# Patient Record
Sex: Male | Born: 1985 | Race: White | Hispanic: No | Marital: Single | Smoking: Current some day smoker
Health system: Southern US, Community
[De-identification: ages and names within clinical notes are randomized; demographics above are authoritative.]

## PROBLEM LIST (undated history)

## (undated) DIAGNOSIS — K219 Gastro-esophageal reflux disease without esophagitis: Secondary | ICD-10-CM

## (undated) DIAGNOSIS — I472 Ventricular tachycardia: Secondary | ICD-10-CM

## (undated) DIAGNOSIS — F419 Anxiety disorder, unspecified: Secondary | ICD-10-CM

## (undated) DIAGNOSIS — I10 Essential (primary) hypertension: Secondary | ICD-10-CM

## (undated) DIAGNOSIS — F1910 Other psychoactive substance abuse, uncomplicated: Secondary | ICD-10-CM

## (undated) DIAGNOSIS — I2510 Atherosclerotic heart disease of native coronary artery without angina pectoris: Secondary | ICD-10-CM

## (undated) DIAGNOSIS — I4729 Other ventricular tachycardia: Secondary | ICD-10-CM

## (undated) DIAGNOSIS — I255 Ischemic cardiomyopathy: Secondary | ICD-10-CM

## (undated) HISTORY — PX: OTHER SURGICAL HISTORY: SHX169

---
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
## 2004-10-25 ENCOUNTER — Emergency Department (HOSPITAL_COMMUNITY): Admission: EM | Admit: 2004-10-25 | Discharge: 2004-10-26 | Payer: Self-pay | Admitting: Emergency Medicine

## 2011-06-27 ENCOUNTER — Encounter (HOSPITAL_COMMUNITY): Payer: Self-pay

## 2011-06-27 ENCOUNTER — Emergency Department (HOSPITAL_COMMUNITY): Payer: Medicaid Other

## 2011-06-27 ENCOUNTER — Other Ambulatory Visit: Payer: Self-pay

## 2011-06-27 ENCOUNTER — Emergency Department (HOSPITAL_COMMUNITY)
Admission: EM | Admit: 2011-06-27 | Discharge: 2011-06-27 | Disposition: A | Discharge status: Home / Self Care | Payer: Medicaid Other | Attending: Emergency Medicine | Admitting: Emergency Medicine

## 2011-06-27 DIAGNOSIS — R071 Chest pain on breathing: Secondary | ICD-10-CM | POA: Insufficient documentation

## 2011-06-27 DIAGNOSIS — R109 Unspecified abdominal pain: Secondary | ICD-10-CM | POA: Insufficient documentation

## 2011-06-27 DIAGNOSIS — M549 Dorsalgia, unspecified: Secondary | ICD-10-CM | POA: Insufficient documentation

## 2011-06-27 DIAGNOSIS — R0789 Other chest pain: Secondary | ICD-10-CM

## 2011-06-27 LAB — BASIC METABOLIC PANEL
BUN: 16 mg/dL (ref 6–23)
Chloride: 102 mEq/L (ref 96–112)
GFR calc Af Amer: >90 mL/min (ref >90)
Glucose, Bld: 92 mg/dL (ref 70–99)
Potassium: 4 mEq/L (ref 3.5–5.1)

## 2011-06-27 LAB — URINALYSIS, ROUTINE W REFLEX MICROSCOPIC
Bilirubin Urine: NEGATIVE
Nitrite: NEGATIVE
Specific Gravity, Urine: 1.026 (ref 1.005–1.030)
pH: 6.5 (ref 5.0–8.0)

## 2011-06-27 LAB — DIFFERENTIAL
Lymphs Abs: 3.1 10*3/uL (ref 0.7–4.0)
Monocytes Relative: 8 % (ref 3–12)
Neutro Abs: 6.4 10*3/uL (ref 1.7–7.7)
Neutrophils Relative %: 60 % (ref 43–77)

## 2011-06-27 LAB — CBC
Hemoglobin: 15.4 g/dL (ref 13.0–17.0)
RBC: 5.13 MIL/uL (ref 4.22–5.81)

## 2011-06-27 MED ORDER — HYDROCODONE-ACETAMINOPHEN 5-325 MG PO TABS: 1.0000 | ORAL_TABLET | Freq: Four times a day (QID) | ORAL | Status: AC | PRN

## 2011-06-27 MED ORDER — CYCLOBENZAPRINE HCL 5 MG PO TABS: 5.0000 mg | ORAL_TABLET | Freq: Three times a day (TID) | ORAL | Status: AC | PRN

## 2011-06-27 MED ORDER — MORPHINE SULFATE 4 MG/ML IJ SOLN
4.0000 mg | Freq: Once | INTRAMUSCULAR | Status: AC
  Administered 2011-06-27: 4 mg via INTRAVENOUS
  Filled 2011-06-27: qty 1

## 2011-06-27 MED ORDER — NAPROXEN 500 MG PO TABS: 500.0000 mg | ORAL_TABLET | Freq: Two times a day (BID) | ORAL | Status: AC

## 2011-06-27 NOTE — ED Notes (Signed)
Pt ambulatory to triage.

## 2011-06-27 NOTE — ED Notes (Signed)
Pt. Having productive cough since Saturday and believes he pulled a muscle in the middle of his back which is causing his CP.  Pain increase with lifting an a deep breath

## 2011-06-27 NOTE — ED Provider Notes (Signed)
History     CSN: [id number]  Arrival date & time 06/27/11  1202   First MD Initiated Contact with Patient 06/27/11 1301      Chief Complaint  Patient presents with  . Back Pain    (Consider location/radiation/quality/duration/timing/severity/associated sxs/prior treatment) HPI Patient presents to the emergency room with complaints of back pain since Saturday night. Patient states she's been having a cough and he thinks he may have the muscle. Patient states the pain is very sharp and severe in his left flank between his ribs. He states the pain increases when he takes a deep breath or when he moves. It also hurts to press on that area of his back. He has not had any shortness of breath. Is not having any leg Swelling.  Patient states she's had some discomfort in his abdomen but no vomiting or diarrhea. He has no prior history of blood clots. There is no family history of aortic dissection, pulmonary embolism or heart disease. History reviewed. No pertinent past medical history.  History reviewed. No pertinent past surgical history.  No family history on file.  History  Substance Use Topics  . Smoking status: Current Everyday Smoker  . Smokeless tobacco: Not on file  . Alcohol Use: Yes      Review of Systems  All other systems reviewed and are negative.    Allergies  Review of patient's allergies indicates no known allergies.  Home Medications   Current Outpatient Rx  Name Route Sig Dispense Refill  . ACETAMINOPHEN-CAFFEINE 500-65 MG PO TABS Oral Take 2 capsules by mouth every 6 (six) hours as needed. For headaches    . OVER THE COUNTER MEDICATION Oral Take 2 tablets by mouth 3 (three) times daily before meals. xenadrine for weight loss (vitamin c 7mg , calcium 70mg , plus caffeine and herbal ingredients)    . RANITIDINE HCL 75 MG PO TABS Oral Take 535 mg by mouth 2 (two) times daily. Takes 9 tablets at a time for stomach pain      BP 138/82  Pulse 76  Temp(Src) 97.9  F (36.6 C) (Oral)  Resp 18  Ht 5\' 7"  (1.702 m)  Wt 210 lb (95.255 kg)  BMI 32.89 kg/m2  SpO2 98%  Physical Exam  Nursing note and vitals reviewed. Constitutional: He appears well-developed and well-nourished. He appears distressed.  HENT:  Head: Normocephalic and atraumatic.  Right Ear: External ear normal.  Left Ear: External ear normal.  Eyes: Conjunctivae are normal. Right eye exhibits no discharge. Left eye exhibits no discharge. No scleral icterus.  Neck: Neck supple. No tracheal deviation present.  Cardiovascular: Normal rate, regular rhythm and intact distal pulses.   Pulmonary/Chest: Effort normal and breath sounds normal. No stridor. No respiratory distress. He has no wheezes. He has no rales. He exhibits tenderness.       Tenderness to palpation left posterior thorax in the left flank region, pain increases with movement and palpation  Abdominal: Soft. Bowel sounds are normal. He exhibits no distension and no mass. There is no tenderness. There is no rebound and no guarding.  Musculoskeletal: He exhibits no edema and no tenderness.  Neurological: He is alert. He has normal strength. No sensory deficit. Cranial nerve deficit:  no gross defecits noted. He exhibits normal muscle tone. He displays no seizure activity. Coordination normal.  Skin: Skin is warm and dry. No rash noted.  Psychiatric: He has a normal mood and affect.    ED Course  Procedures (including critical care time)  Date: 06/27/2011  Rate: 71  Rhythm: normal sinus rhythm  QRS Axis: normal  Intervals: normal  ST/T Wave abnormalities: normal  Conduction Disutrbances:none  Narrative Interpretation:   Old EKG Reviewed: none available   Labs Reviewed  CBC - Abnormal; Notable for the following:    WBC 10.8 (*)    All other components within normal limits  BASIC METABOLIC PANEL - Abnormal; Notable for the following:    GFR calc non Af Amer 81 (*)    All other components within normal limits    DIFFERENTIAL  URINALYSIS, ROUTINE W REFLEX MICROSCOPIC  URINALYSIS, ROUTINE W REFLEX MICROSCOPIC   Dg Chest 2 View  06/27/2011  *RADIOLOGY REPORT*  Clinical Data: Chest pain, shortness of breath  CHEST - 2 VIEW  Comparison: None.  Findings: Lungs are clear. No pleural effusion or pneumothorax.  Cardiomediastinal silhouette is within normal limits.  Visualized osseous structures are within normal limits.  IMPRESSION: No evidence of acute cardiopulmonary disease.  Original Report Authenticated By: Charline Bills, M.D.      MDM  I suspect the patient's back pain is related to pleurisy and musculoskeletal chest pain. He has no evidence of pneumonia pneumothorax. The pain is reproducible. Patient has no risk factors to suggest pulmonary embolism and I doubt aortic dissection based on his history presentation.  Patient was discharged with a prescription for pain medications. Encouraged follow up with his primary care Dr. to make sure his symptoms resolve        Celene Kras, MD 06/27/11 [phone]

## 2012-07-21 ENCOUNTER — Encounter (HOSPITAL_COMMUNITY): Payer: Self-pay | Admitting: *Deleted

## 2012-07-21 ENCOUNTER — Emergency Department (HOSPITAL_COMMUNITY): Payer: Medicaid Other

## 2012-07-21 ENCOUNTER — Emergency Department (HOSPITAL_COMMUNITY)
Admission: EM | Admit: 2012-07-21 | Discharge: 2012-07-21 | Disposition: A | Discharge status: Home / Self Care | Payer: Medicaid Other | Attending: Emergency Medicine | Admitting: Emergency Medicine

## 2012-07-21 DIAGNOSIS — R030 Elevated blood-pressure reading, without diagnosis of hypertension: Secondary | ICD-10-CM | POA: Insufficient documentation

## 2012-07-21 DIAGNOSIS — Y9389 Activity, other specified: Secondary | ICD-10-CM | POA: Insufficient documentation

## 2012-07-21 DIAGNOSIS — F17200 Nicotine dependence, unspecified, uncomplicated: Secondary | ICD-10-CM | POA: Insufficient documentation

## 2012-07-21 DIAGNOSIS — Y929 Unspecified place or not applicable: Secondary | ICD-10-CM | POA: Insufficient documentation

## 2012-07-21 DIAGNOSIS — S2232XA Fracture of one rib, left side, initial encounter for closed fracture: Secondary | ICD-10-CM

## 2012-07-21 DIAGNOSIS — Z79899 Other long term (current) drug therapy: Secondary | ICD-10-CM | POA: Insufficient documentation

## 2012-07-21 DIAGNOSIS — S2239XA Fracture of one rib, unspecified side, initial encounter for closed fracture: Secondary | ICD-10-CM | POA: Insufficient documentation

## 2012-07-21 DIAGNOSIS — W108XXA Fall (on) (from) other stairs and steps, initial encounter: Secondary | ICD-10-CM | POA: Insufficient documentation

## 2012-07-21 MED ORDER — BENZONATATE 100 MG PO CAPS: 100.0000 mg | ORAL_CAPSULE | Freq: Three times a day (TID) | ORAL | Status: DC

## 2012-07-21 MED ORDER — OXYCODONE-ACETAMINOPHEN 5-325 MG PO TABS
2.0000 | ORAL_TABLET | Freq: Once | ORAL | Status: AC
  Administered 2012-07-21: 2 via ORAL
  Filled 2012-07-21: qty 2

## 2012-07-21 MED ORDER — OXYCODONE-ACETAMINOPHEN 5-325 MG PO TABS: ORAL_TABLET | ORAL | Status: DC

## 2012-07-21 NOTE — ED Notes (Signed)
Pt states fell down 6 stairs this morning around 0800, landed on L side, complaining of L rib pain, states 10/10 when coughing/laughing or moving, no pain when sitting still. Pt denies shortness of breath or LOC.

## 2012-07-21 NOTE — ED Provider Notes (Signed)
History     CSN: [id number]  Arrival date & time 07/21/12  1226   First MD Initiated Contact with Patient 07/21/12 1240      Chief Complaint  Patient presents with  . Fall  . Pain    (Consider location/radiation/quality/duration/timing/severity/associated sxs/prior treatment) HPI  Mitchell Stokes is a 27 y.o. male complaining of left posterior rib pain status post trip and fall this morning down 6 step. Patient denies head trauma, LOC, nausea vomiting, headache, shortness of breath, abdominal pain, change in bowel or bladder habits.  History reviewed. No pertinent past medical history.  History reviewed. No pertinent past surgical history.  No family history on file.  History  Substance Use Topics  . Smoking status: Current Every Day Smoker  . Smokeless tobacco: Not on file  . Alcohol Use: Yes      Review of Systems  Constitutional: Negative for fever.  Respiratory: Negative for shortness of breath.   Cardiovascular: Negative for chest pain.  Gastrointestinal: Negative for nausea, vomiting, abdominal pain and diarrhea.  All other systems reviewed and are negative.    Allergies  Review of patient's allergies indicates no known allergies.  Home Medications   Current Outpatient Rx  Name  Route  Sig  Dispense  Refill  . Acetaminophen-Caffeine (TENSION HEADACHE) 500-65 MG TABS   Oral   Take 2 capsules by mouth every 6 (six) hours as needed. For headaches         . OVER THE COUNTER MEDICATION   Oral   Take 2 tablets by mouth 3 (three) times daily before meals. xenadrine for weight loss (vitamin c 7mg , calcium 70mg , plus caffeine and herbal ingredients)         . ranitidine (ZANTAC) 75 MG tablet   Oral   Take 535 mg by mouth 2 (two) times daily. Takes 9 tablets at a time for stomach pain           BP 144/107  Pulse 76  Temp(Src) 98 F (36.7 C) (Oral)  Resp 18  SpO2 97%  Physical Exam  Nursing note and vitals reviewed. Constitutional: He is  oriented to person, place, and time. He appears well-developed and well-nourished. No distress.  HENT:  Head: Normocephalic.  Eyes: Conjunctivae and EOM are normal.  Cardiovascular: Normal rate.   Pulmonary/Chest: Effort normal and breath sounds normal. No stridor. No respiratory distress. He has no wheezes. He has no rales.   He exhibits tenderness.  Abdominal: Soft.  Musculoskeletal: Normal range of motion.  Neurological: He is alert and oriented to person, place, and time.  Psychiatric: He has a normal mood and affect.    ED Course  Procedures (including critical care time)  Labs Reviewed - No data to display Dg Ribs Unilateral W/chest Left  07/21/2012  *RADIOLOGY REPORT*  Clinical Data: Pain post trauma  LEFT RIBS AND CHEST - 3+ VIEW  Frontal chest; oblique, coned-down lower left ribs  Comparison:  Chest radiograph June 27, 2011  Findings:  There is a fracture of the posterior left ninth rib, minimally displaced.  There is no other acute fracture.  No pneumothorax or effusion.  Lungs clear.  Heart size and pulmonary vascularity are normal.  IMPRESSION: Fracture posterior left ninth rib.  No pneumothorax.  Lungs clear.   Original Report Authenticated By: Bretta Bang, M.D.      1. Rib fracture, left, closed, initial encounter   2. Elevated BP       MDM  Isolated rib fracture with no  pneumothorax. We'll be aggressive in my pain control I have advised him to take deep breaths every hour to prevent atelectasis and pneumonia. Patient verbalizes understanding. Return precautions given.  Discharge Medication List as of 07/21/2012  1:35 PM    START taking these medications   Details  benzonatate (TESSALON) 100 MG capsule Take 1 capsule (100 mg total) by mouth every 8 (eight) hours., Starting 07/21/2012, Until Discontinued, Print    oxyCODONE-acetaminophen (PERCOCET/ROXICET) 5-325 MG per tablet 1 to 2 tabs PO q6hrs  PRN for pain, Print              Wynetta Emery,  PA-C 07/21/12 1358

## 2012-07-23 NOTE — ED Provider Notes (Signed)
Medical screening examination/treatment/procedure(s) were performed by non-physician practitioner and as supervising physician I was immediately available for consultation/collaboration.   Suzi Roots, MD 07/23/12 [phone]

## 2012-11-09 ENCOUNTER — Encounter (HOSPITAL_COMMUNITY): Payer: Self-pay | Admitting: Cardiology

## 2012-11-09 ENCOUNTER — Inpatient Hospital Stay (HOSPITAL_COMMUNITY)
Admission: EM | Admit: 2012-11-09 | Discharge: 2012-11-16 | DRG: 247 | Disposition: A | Discharge status: Home / Self Care | Payer: Medicaid Other | Attending: Cardiology | Admitting: Cardiology

## 2012-11-09 ENCOUNTER — Observation Stay (HOSPITAL_COMMUNITY): Payer: Medicaid Other

## 2012-11-09 DIAGNOSIS — E785 Hyperlipidemia, unspecified: Secondary | ICD-10-CM | POA: Diagnosis present

## 2012-11-09 DIAGNOSIS — F1910 Other psychoactive substance abuse, uncomplicated: Secondary | ICD-10-CM | POA: Diagnosis present

## 2012-11-09 DIAGNOSIS — Y840 Cardiac catheterization as the cause of abnormal reaction of the patient, or of later complication, without mention of misadventure at the time of the procedure: Secondary | ICD-10-CM | POA: Diagnosis not present

## 2012-11-09 DIAGNOSIS — E782 Mixed hyperlipidemia: Secondary | ICD-10-CM

## 2012-11-09 DIAGNOSIS — F1210 Cannabis abuse, uncomplicated: Secondary | ICD-10-CM | POA: Diagnosis present

## 2012-11-09 DIAGNOSIS — Z955 Presence of coronary angioplasty implant and graft: Secondary | ICD-10-CM

## 2012-11-09 DIAGNOSIS — I4729 Other ventricular tachycardia: Secondary | ICD-10-CM | POA: Diagnosis present

## 2012-11-09 DIAGNOSIS — I748 Embolism and thrombosis of other arteries: Secondary | ICD-10-CM | POA: Diagnosis not present

## 2012-11-09 DIAGNOSIS — I2510 Atherosclerotic heart disease of native coronary artery without angina pectoris: Secondary | ICD-10-CM | POA: Diagnosis present

## 2012-11-09 DIAGNOSIS — K219 Gastro-esophageal reflux disease without esophagitis: Secondary | ICD-10-CM | POA: Diagnosis present

## 2012-11-09 DIAGNOSIS — I214 Non-ST elevation (NSTEMI) myocardial infarction: Secondary | ICD-10-CM

## 2012-11-09 DIAGNOSIS — I2589 Other forms of chronic ischemic heart disease: Secondary | ICD-10-CM | POA: Diagnosis present

## 2012-11-09 DIAGNOSIS — I998 Other disorder of circulatory system: Secondary | ICD-10-CM | POA: Diagnosis not present

## 2012-11-09 DIAGNOSIS — F17200 Nicotine dependence, unspecified, uncomplicated: Secondary | ICD-10-CM | POA: Diagnosis present

## 2012-11-09 DIAGNOSIS — R079 Chest pain, unspecified: Secondary | ICD-10-CM

## 2012-11-09 DIAGNOSIS — F419 Anxiety disorder, unspecified: Secondary | ICD-10-CM | POA: Diagnosis present

## 2012-11-09 DIAGNOSIS — R9431 Abnormal electrocardiogram [ECG] [EKG]: Secondary | ICD-10-CM

## 2012-11-09 DIAGNOSIS — I10 Essential (primary) hypertension: Secondary | ICD-10-CM

## 2012-11-09 DIAGNOSIS — I472 Ventricular tachycardia, unspecified: Secondary | ICD-10-CM | POA: Diagnosis not present

## 2012-11-09 DIAGNOSIS — F1110 Opioid abuse, uncomplicated: Secondary | ICD-10-CM | POA: Diagnosis present

## 2012-11-09 DIAGNOSIS — I255 Ischemic cardiomyopathy: Secondary | ICD-10-CM | POA: Diagnosis present

## 2012-11-09 DIAGNOSIS — I2109 ST elevation (STEMI) myocardial infarction involving other coronary artery of anterior wall: Principal | ICD-10-CM

## 2012-11-09 DIAGNOSIS — I519 Heart disease, unspecified: Secondary | ICD-10-CM

## 2012-11-09 DIAGNOSIS — F1010 Alcohol abuse, uncomplicated: Secondary | ICD-10-CM | POA: Diagnosis present

## 2012-11-09 DIAGNOSIS — F411 Generalized anxiety disorder: Secondary | ICD-10-CM | POA: Diagnosis present

## 2012-11-09 HISTORY — DX: Gastro-esophageal reflux disease without esophagitis: K21.9

## 2012-11-09 HISTORY — DX: Essential (primary) hypertension: I10

## 2012-11-09 HISTORY — DX: Anxiety disorder, unspecified: F41.9

## 2012-11-09 HISTORY — DX: Other psychoactive substance abuse, uncomplicated: F19.10

## 2012-11-09 HISTORY — DX: Atherosclerotic heart disease of native coronary artery without angina pectoris: I25.10

## 2012-11-09 HISTORY — DX: Other ventricular tachycardia: I47.29

## 2012-11-09 HISTORY — DX: Ischemic cardiomyopathy: I25.5

## 2012-11-09 HISTORY — DX: Ventricular tachycardia: I47.2

## 2012-11-09 LAB — TROPONIN I
Troponin I: <0.3 ng/mL (ref <0.30)
Troponin I: 0.8 ng/mL (ref <0.30)

## 2012-11-09 LAB — BASIC METABOLIC PANEL
BUN: 12 mg/dL (ref 6–23)
CO2: 23 mEq/L (ref 19–32)
Calcium: 9.2 mg/dL (ref 8.4–10.5)
Chloride: 102 mEq/L (ref 96–112)
Creatinine, Ser: 1.1 mg/dL (ref 0.50–1.35)
GFR calc Af Amer: >90 mL/min (ref >90)
GFR calc non Af Amer: >90 mL/min (ref >90)
Glucose, Bld: 103 mg/dL — ABNORMAL HIGH (ref 70–99)
Potassium: 3.3 mEq/L — ABNORMAL LOW (ref 3.5–5.1)
Sodium: 140 mEq/L (ref 135–145)

## 2012-11-09 LAB — CBC
HCT: 44.4 % (ref 39.0–52.0)
Hemoglobin: 16 g/dL (ref 13.0–17.0)
MCH: 30.6 pg (ref 26.0–34.0)
MCHC: 36 g/dL (ref 30.0–36.0)
MCV: 84.9 fL (ref 78.0–100.0)
Platelets: 222 10*3/uL (ref 150–400)
RBC: 5.23 MIL/uL (ref 4.22–5.81)
RDW: 12.5 % (ref 11.5–15.5)
WBC: 10.7 10*3/uL — ABNORMAL HIGH (ref 4.0–10.5)

## 2012-11-09 LAB — RAPID URINE DRUG SCREEN, HOSP PERFORMED
Amphetamines: NOT DETECTED
Barbiturates: NOT DETECTED
Benzodiazepines: NOT DETECTED
Cocaine: NOT DETECTED
Opiates: POSITIVE — AB
Tetrahydrocannabinol: POSITIVE — AB

## 2012-11-09 LAB — PROTIME-INR
INR: 0.9 (ref 0.00–1.49)
Prothrombin Time: 12.1 seconds (ref 11.6–15.2)

## 2012-11-09 LAB — AMYLASE: Amylase: 46 U/L (ref 0–105)

## 2012-11-09 LAB — COMPREHENSIVE METABOLIC PANEL
BUN: 12 mg/dL (ref 6–23)
CO2: 23 mEq/L (ref 19–32)
Calcium: 8.7 mg/dL (ref 8.4–10.5)
Chloride: 101 mEq/L (ref 96–112)
Creatinine, Ser: 1.01 mg/dL (ref 0.50–1.35)
GFR calc Af Amer: >90 mL/min (ref >90)
GFR calc non Af Amer: >90 mL/min (ref >90)
Glucose, Bld: 102 mg/dL — ABNORMAL HIGH (ref 70–99)
Total Bilirubin: 0.5 mg/dL (ref 0.3–1.2)

## 2012-11-09 LAB — LIPASE, BLOOD: Lipase: 17 U/L (ref 11–59)

## 2012-11-09 LAB — MRSA PCR SCREENING: MRSA by PCR: NEGATIVE

## 2012-11-09 LAB — APTT: aPTT: 26 seconds (ref 24–37)

## 2012-11-09 LAB — PRO B NATRIURETIC PEPTIDE: Pro B Natriuretic peptide (BNP): 437.4 pg/mL — ABNORMAL HIGH (ref 0–125)

## 2012-11-09 MED ORDER — LORAZEPAM 2 MG/ML IJ SOLN
0.5000 mg | Freq: Once | INTRAMUSCULAR | Status: AC
  Administered 2012-11-09: 0.5 mg via INTRAVENOUS
  Filled 2012-11-09: qty 1

## 2012-11-09 MED ORDER — ACETAMINOPHEN 325 MG PO TABS
650.0000 mg | ORAL_TABLET | ORAL | Status: DC | PRN
  Administered 2012-11-10: 650 mg via ORAL
  Filled 2012-11-09 (×2): qty 2

## 2012-11-09 MED ORDER — ONDANSETRON HCL 4 MG/2ML IJ SOLN
4.0000 mg | Freq: Four times a day (QID) | INTRAMUSCULAR | Status: DC | PRN
  Administered 2012-11-09 – 2012-11-12 (×3): 4 mg via INTRAVENOUS
  Filled 2012-11-09 (×3): qty 2

## 2012-11-09 MED ORDER — ASPIRIN EC 81 MG PO TBEC
81.0000 mg | DELAYED_RELEASE_TABLET | Freq: Every day | ORAL | Status: DC
  Administered 2012-11-11 – 2012-11-16 (×5): 81 mg via ORAL
  Filled 2012-11-09 (×7): qty 1

## 2012-11-09 MED ORDER — NITROGLYCERIN IN D5W 200-5 MCG/ML-% IV SOLN
2.0000 ug/min | Freq: Once | INTRAVENOUS | Status: AC
  Administered 2012-11-09: 10 ug/min via INTRAVENOUS

## 2012-11-09 MED ORDER — HEPARIN BOLUS VIA INFUSION
4000.0000 [IU] | Freq: Once | INTRAVENOUS | Status: AC
  Administered 2012-11-09: 4000 [IU] via INTRAVENOUS

## 2012-11-09 MED ORDER — HEPARIN (PORCINE) IN NACL 100-0.45 UNIT/ML-% IJ SOLN
2000.0000 [IU]/h | INTRAMUSCULAR | Status: DC
  Administered 2012-11-09: 1250 [IU]/h via INTRAVENOUS
  Filled 2012-11-09 (×4): qty 250

## 2012-11-09 MED ORDER — ASPIRIN 81 MG PO CHEW: 324.0000 mg | CHEWABLE_TABLET | ORAL | Status: AC

## 2012-11-09 MED ORDER — FOLIC ACID 1 MG PO TABS
1.0000 mg | ORAL_TABLET | Freq: Every day | ORAL | Status: DC
  Administered 2012-11-09 – 2012-11-16 (×7): 1 mg via ORAL
  Filled 2012-11-09 (×8): qty 1

## 2012-11-09 MED ORDER — MORPHINE SULFATE 2 MG/ML IJ SOLN
2.0000 mg | INTRAMUSCULAR | Status: DC | PRN
  Administered 2012-11-10 – 2012-11-13 (×10): 2 mg via INTRAVENOUS
  Filled 2012-11-09 (×12): qty 1

## 2012-11-09 MED ORDER — LORAZEPAM 2 MG/ML IJ SOLN: 1.0000 mg | Freq: Four times a day (QID) | INTRAMUSCULAR | Status: AC | PRN

## 2012-11-09 MED ORDER — AMLODIPINE BESYLATE 2.5 MG PO TABS
2.5000 mg | ORAL_TABLET | Freq: Every day | ORAL | Status: DC
  Administered 2012-11-09 – 2012-11-10 (×2): 2.5 mg via ORAL
  Filled 2012-11-09 (×3): qty 1

## 2012-11-09 MED ORDER — NITROGLYCERIN 0.4 MG SL SUBL
0.4000 mg | SUBLINGUAL_TABLET | SUBLINGUAL | Status: DC | PRN
  Administered 2012-11-09 – 2012-11-12 (×5): 0.4 mg via SUBLINGUAL
  Filled 2012-11-09 (×3): qty 25

## 2012-11-09 MED ORDER — VITAMIN B-1 100 MG PO TABS
100.0000 mg | ORAL_TABLET | Freq: Every day | ORAL | Status: DC
  Administered 2012-11-10 – 2012-11-16 (×6): 100 mg via ORAL
  Filled 2012-11-09 (×8): qty 1

## 2012-11-09 MED ORDER — PANTOPRAZOLE SODIUM 40 MG PO TBEC
40.0000 mg | DELAYED_RELEASE_TABLET | Freq: Every day | ORAL | Status: DC
  Administered 2012-11-09 – 2012-11-16 (×8): 40 mg via ORAL
  Filled 2012-11-09 (×8): qty 1

## 2012-11-09 MED ORDER — ASPIRIN 300 MG RE SUPP
300.0000 mg | RECTAL | Status: AC
  Filled 2012-11-09: qty 1

## 2012-11-09 MED ORDER — NITROGLYCERIN IN D5W 200-5 MCG/ML-% IV SOLN: 3.0000 ug/min | INTRAVENOUS | Status: DC

## 2012-11-09 MED ORDER — SODIUM CHLORIDE 0.9 % IV BOLUS (SEPSIS)
1000.0000 mL | Freq: Once | INTRAVENOUS | Status: AC
  Administered 2012-11-09: 1000 mL via INTRAVENOUS

## 2012-11-09 MED ORDER — THIAMINE HCL 100 MG/ML IJ SOLN
100.0000 mg | Freq: Every day | INTRAMUSCULAR | Status: DC
  Administered 2012-11-09: 100 mg via INTRAVENOUS
  Filled 2012-11-09 (×7): qty 1

## 2012-11-09 MED ORDER — MORPHINE SULFATE 4 MG/ML IJ SOLN
4.0000 mg | Freq: Once | INTRAMUSCULAR | Status: AC
  Administered 2012-11-09: 4 mg via INTRAVENOUS
  Filled 2012-11-09: qty 1

## 2012-11-09 MED ORDER — NITROGLYCERIN IN D5W 200-5 MCG/ML-% IV SOLN: 2.0000 ug/min | INTRAVENOUS | Status: DC

## 2012-11-09 MED ORDER — NITROGLYCERIN IN D5W 200-5 MCG/ML-% IV SOLN
INTRAVENOUS | Status: AC
  Filled 2012-11-09: qty 250

## 2012-11-09 MED ORDER — SODIUM CHLORIDE 0.9 % IV SOLN
Freq: Once | INTRAVENOUS | Status: AC
  Administered 2012-11-09: 14:00:00 via INTRAVENOUS

## 2012-11-09 MED ORDER — MORPHINE SULFATE 4 MG/ML IJ SOLN
INTRAMUSCULAR | Status: AC
  Administered 2012-11-09: 4 mg
  Filled 2012-11-09: qty 1

## 2012-11-09 MED ORDER — SODIUM CHLORIDE 0.9 % IV SOLN
INTRAVENOUS | Status: DC
  Administered 2012-11-09: 20 mL/h via INTRAVENOUS

## 2012-11-09 MED ORDER — LORAZEPAM 2 MG/ML IJ SOLN
2.0000 mg | Freq: Once | INTRAMUSCULAR | Status: AC
  Administered 2012-11-09: 2 mg via INTRAVENOUS
  Filled 2012-11-09: qty 1

## 2012-11-09 MED ORDER — LORAZEPAM 1 MG PO TABS
1.0000 mg | ORAL_TABLET | Freq: Four times a day (QID) | ORAL | Status: AC | PRN
  Administered 2012-11-10 – 2012-11-11 (×3): 1 mg via ORAL
  Filled 2012-11-09 (×3): qty 1

## 2012-11-09 MED ORDER — ADULT MULTIVITAMIN W/MINERALS CH
1.0000 | ORAL_TABLET | Freq: Every day | ORAL | Status: DC
  Administered 2012-11-09 – 2012-11-16 (×7): 1 via ORAL
  Filled 2012-11-09 (×8): qty 1

## 2012-11-09 NOTE — ED Notes (Signed)
Pt from home co cp. Initial 9/10 central chest and arm tingling. Per EMS, pt was diaphortic and pale on scene. Received 324 asa and 1 nitro w/relief pta. Pain 3/10 after nitro.

## 2012-11-09 NOTE — Progress Notes (Signed)
ANTICOAGULATION CONSULT NOTE - Initial Consult  Pharmacy Consult for Heparin Indication: chest pain/ACS  No Known Allergies  Patient Measurements: Height: 5\' 9"  (175.3 cm) Weight: 240 lb (108.863 kg) IBW/kg (Calculated) : 70.7 Heparin Dosing Weight: 94.5kg  Vital Signs: Temp: 98.3 F (36.8 C) (06/08 1411) Temp src: Oral (06/08 1411) BP: 150/87 mmHg (06/08 1515) Pulse Rate: 89 (06/08 1515)  Labs:  Recent Labs  11/09/12 1307 11/09/12 1310  HGB  --  16.0  HCT  --  44.4  PLT  --  222  CREATININE  --  1.10  TROPONINI <0.30  --     Estimated Creatinine Clearance: 121.6 ml/min (by C-G formula based on Cr of 1.1).   Medical History: Past Medical History  Diagnosis Date  . HTN (hypertension)     Medications:  See electronic med rec  Assessment: 27yom to start heparin for CP/ACS and EKG abnormalities. No bleeding is reported and patient not on anticoagulants. - Baseline labs pending - Heparin weight: 94.5  Goal of Therapy:  Heparin level 0.3-0.7 units/ml Monitor platelets by anticoagulation protocol: Yes   Plan:  1. Heparin IV bolus 4000 units x 1 2. Heparin drip 1250 units/hr (12.5 ml/hr) 3. Check heparin level 6 hours after initiation 4. Daily heparin level and CBC  Cleon Dew [phone] 11/09/2012,3:31 PM

## 2012-11-09 NOTE — ED Provider Notes (Signed)
History    27yM with CP. Onset less than an hour prior to arrival. Center of his chest. Feels achy. Onset while at rest. Some tingling in both elbows. Pain has been constant since onset, but improved when given nitro prehospital. Also given ASA. Never had pain like this before. Diaphoretic. No cough, fever or chills. No unusual leg pain or swelling. No back pain. Has used cocaine previously, but not in several years. Adamantly denying use recently but was drinking heavily last night. No significant past medical history, but has noted consistently high blood pressures previously when taking his BP at the drug store. Not on meds. Is a smoker. Recently started taking "Fastin," a weight loss supplement.   CSN: [id number]  Arrival date & time 11/09/12  1227   First MD Initiated Contact with Patient 11/09/12 1229      Chief Complaint  Patient presents with  . Chest Pain    (Consider location/radiation/quality/duration/timing/severity/associated sxs/prior treatment) HPI  No past medical history on file.  No past surgical history on file.  No family history on file.  History  Substance Use Topics  . Smoking status: Current Every Day Smoker  . Smokeless tobacco: Not on file  . Alcohol Use: Yes      Review of Systems  All systems reviewed and negative, other than as noted in HPI.   Allergies  Review of patient's allergies indicates no known allergies.  Home Medications  No current outpatient prescriptions on file.  BP 152/96  Pulse 110  Temp(Src) 98.2 F (36.8 C) (Oral)  Resp 12  SpO2 98%  Physical Exam  Nursing note and vitals reviewed. Constitutional: He appears distressed.  Pt appears uncomfortable. Diaphoretic.   HENT:  Head: Normocephalic and atraumatic.  Eyes: Conjunctivae are normal. Right eye exhibits no discharge. Left eye exhibits no discharge.  Neck: Neck supple.  Cardiovascular: Regular rhythm and normal heart sounds.  Exam reveals no gallop and no friction  rub.   No murmur heard. tachycardic  Pulmonary/Chest: Effort normal and breath sounds normal. No respiratory distress.  Abdominal: Soft. He exhibits no distension. There is no tenderness.  Musculoskeletal: He exhibits no edema and no tenderness.  Lower extremities symmetric as compared to each other. No calf tenderness. Negative Homan's. No palpable cords.   Neurological: He is alert.  Skin: Skin is warm. He is diaphoretic.  Psychiatric: He has a normal mood and affect. His behavior is normal. Thought content normal.    ED Course  Procedures (including critical care time)  CRITICAL CARE Performed by: Raeford Razor  Total critical care time: 35 minutes  Critical care time was exclusive of separately billable procedures and treating other patients. Critical care was necessary to treat or prevent imminent or life-threatening deterioration. Critical care was time spent personally by me on the following activities: development of treatment plan with patient and/or surrogate as well as nursing, discussions with consultants, evaluation of patient's response to treatment, examination of patient, obtaining history from patient or surrogate, ordering and performing treatments and interventions, ordering and review of laboratory studies, ordering and review of radiographic studies, pulse oximetry and re-evaluation of patient's condition.   Labs Reviewed  CBC - Abnormal; Notable for the following:    WBC 10.7 (*)    All other components within normal limits  TROPONIN I  BASIC METABOLIC PANEL  URINE RAPID DRUG SCREEN (HOSP PERFORMED)  PROTIME-INR  APTT   No results found.  EKG:  Rhythm: normal sinus Vent. rate 73 BPM PR interval 136  ms QRS duration 92 ms QT/QTc 412/454 ms ST segments:ST depression II,III, aVF Comparison: ST depression new as compared 06/2011    1. Chest pain   2. Abnormal EKG   3. Hypertension       MDM  1:05 PM 27yM with CP. Abnormal EKG and changed from  prior. Pt appears very uncomfortable and diaphoretic. Pt denies drug use but recently started taking "Fastin" weight loss supplement. In reviewing ingredient list, I suspect this is the most likely culprit. ASA given. Morphine and benzos. Nitro if remains hypertensive or symptomatic. No significant past medical history. Heparin ordered for ACS although symptoms pretty atypical. If actually cardiac,  suspect more likely hypertensive crisis or vasospasm versus structural heart disease though. Little in terms of PMHx. Is a smoker and I suspect may have hypertension although no formal diagnosis. Consider other potential etiology such as PE, dissection, pneumothorax, infectious, but my suspicion remains low at this time.   1:12 PM Discussed with Dr Antoine Poche, cardiology. Was at bedside in less than a minute.         Raeford Razor, MD 11/09/12 1357

## 2012-11-09 NOTE — ED Notes (Signed)
Patient transported to X-ray 

## 2012-11-09 NOTE — Progress Notes (Addendum)
Brief X-Cover Note  MD Tresa Endo earlier for patient Mitchell Stokes admitted with chest pain who had initial negative cardiac markers that have resulted positive and he has had some intermittent recurrent CP. His NTG gtt is currently at 20 mcg with improved pain. On my examination he was more comfortable per primary RN but on my evaluation CP/pressure recurrent but different character (less pressure, no burning). He tells me he drinks heavily once a weekly - splitting a galloon or more of tequila. Otherwise, he uses marijuana and no other drugs. He works intermittently doing various jobs but has not worked much recently and took his mother to the [employer] today without issues.   CP improving. Vitals reviewed; BP 137/93  Pulse 88  Temp(Src) 97.9 F (36.6 C) (Oral)  Resp 18  Ht 5\' 8"  (1.727 m)  Wt 107.23 kg (236 lb 6.4 oz)  BMI 35.95 kg/m2  SpO2 100%.fully conversant, mildly uncomfortable; heart RRR, lungs CTAB, extremities warm. Abd soft but mildly tender in sub-xiphoid area. Troponin 0.8. Repeat ECG compared with previous and less ST depression inferiorly. No overt ST elevation. Differential remains unstable angina/ACS given positive troponin. However, he also has some atypical pain along with typical pain (responsive to NTG, pressure-like character). Pancreatitis in addition to or as stressor also possible. Will add on amylase/lipase, transfer Mitchell Stokes to Step-down and consider more urgent LHC should pain not stabilize/resolve.   Leeann Must, MD    Check on Patient again at 19:45. He's CP has resolved but he has SOB and some abdominal discomfort. His family arrived and Mitchell Stokes gave me permission to speak with them. He let me know that he smokes 2-3 bowls of marijuana daily. His family corroborated that he drinks etoh frequently and heavily and he takes any pills he can get his hands on - particularly hydrocodone/oxycodone/xanax. On exam, vitals stable. BP 125/83  Pulse 92  Temp(Src) 97.9 F (36.6  C) (Oral)  Resp 23  Ht 5\' 8"  (1.727 m)  Wt 107.23 kg (236 lb 6.4 oz)  BMI 35.95 kg/m2  SpO2 99%. No CP but some abdominal discomfort. Nausea/vomiting+. Labs about to be drawn evaluating pancreatitis. PRN IV morphone. Anxiolytics. Trending troponins. On heparin.   Leeann Must, MD  Some nausea early this morning and a run of VT ~ 20-25 beats. Potassium 20 meq ordered, phenergan for nausea. He felt improved and CP stable. Troponin going up but hemodynamically stable. Vitals reviewed; BP 121/71  Pulse 78  Temp(Src) 97.9 F (36.6 C) (Oral)  Resp 27  Ht 5\' 8"  (1.727 m)  Wt 107.23 kg (236 lb 6.4 oz)  BMI 35.95 kg/m2  SpO2 94% Will address LHC first thing in AM.   Leeann Must, MD

## 2012-11-09 NOTE — H&P (Signed)
CARDIOLOGY ADMISSION NOTE  Patient ID: Mitchell Stokes MRN: [id number] DOB/AGE: Nov 18, 1985 27 y.o.  Admit date: 11/09/2012 Primary Physician   None Primary Cardiologist   None Chief Complaint    Chest pain  HPI:  I was called urgently to the emergency room to see the patient to was having 7/10 chest discomfort. He has no prior cardiac history though he has a history of hypertension. He said it was mid chest. It was 7 out of 10 in intensity and it started about an hour ago.  he did have some bilateral shoulder discomfort. He said it felt worse sitting up. It was worse with inspiration. He did get initially some sublingual nitroglycerin with improvement. However, the pain returned. He had some improvement with morphine. He received another nitroglycerin during my examination and is now comfortable. He said he had associated nausea but was not describing any shortness of breath, PND or orthopnea. He's not had any palpitations, presyncope or syncope. He's never had this discomfort before. This is not like his previous reflux. His initial EKG did demonstrate some inferior T-wave inversion new from previous EKGs. Of note he does use a dietary supplementation that includes synephrine.  He also was hung over this morning as he and 2 friends split a half gallon of tequila last night. He spent the morning throwing up and having diarrhea.  Past Medical History  Diagnosis Date  . HTN (hypertension)     Past Surgical History  Procedure Laterality Date  . None      No Known Allergies No current facility-administered medications on file prior to encounter.   No current outpatient prescriptions on file prior to encounter.   History   Social History  . Marital Status: Single    Spouse Name: N/A    Number of Children: 2  . Years of Education: N/A   Occupational History  . Not working    Social History Main Topics  . Smoking status: Current Every Day Smoker    Types: Cigarettes  . Smokeless  tobacco: Not on file     Comment: One cigarette per day.   . Alcohol Use: Yes  . Drug Use: Yes    Special: Marijuana  . Sexually Active: Yes   Other Topics Concern  . Not on file   Social History Narrative  . No narrative on file    Family History  Problem Relation Age of Onset  . Hypertension Mother   . Stroke Mother     ROS:  As stated in the HPI and negative for all other systems.  Physical Exam: Blood pressure 152/96, pulse 110, temperature 98.2 F (36.8 C), temperature source Oral, resp. rate 12, height 5\' 9"  (1.753 m), weight 240 lb (108.863 kg), SpO2 98.00%.  GENERAL:  Well appearing HEENT:  Pupils equal round and reactive, fundi not visualized, oral mucosa unremarkable NECK:  No jugular venous distention, waveform within normal limits, carotid upstroke brisk and symmetric, no bruits, no thyromegaly LYMPHATICS:  No cervical, inguinal adenopathy LUNGS:  Clear to auscultation bilaterally BACK:  No CVA tenderness CHEST:  Unremarkable HEART:  PMI not displaced or sustained,S1 and S2 within normal limits, no S3, no S4, no clicks, no rubs, no murmurs ABD:  Flat, positive bowel sounds normal in frequency in pitch, no bruits, no rebound, no guarding, no midline pulsatile mass, no hepatomegaly, no splenomegaly EXT:  2 plus pulses throughout, no edema, no cyanosis no clubbing SKIN:  No rashes no nodules NEURO:  Cranial nerves II  through XII grossly intact, motor grossly intact throughout [facility]:  Cognitively intact, oriented to person place and time  Labs:  Pending  Radiology:  Pending  EKG:  Pending  ASSESSMENT AND PLAN:    CHEST PAIN:  This has predominantly atypical features.  EKG changes could represent ischemia but not acute ST segment elevation. At this point I'm going to manage him medically and I will cycle cardiac enzymes, start heparin and IV nitroglycerin. We'll get an echocardiogram. Pending the outcome of this I will consider further evaluation with either  catheterization or stress testing.   HTN:  He has a history of this but doesn't take medications.  He will be started on a low dose of Norvasc to cover this and possible coronary vasospasm.   SignedRollene Rotunda 11/09/2012, 1:37 PM

## 2012-11-09 NOTE — Progress Notes (Signed)
Dr. Tresa Endo in to see pt.   Pt currently pain free. States he is short of breath and nauseated.  Pt vomited large amount of emesis. Lab work ordered and meds ordered.

## 2012-11-09 NOTE — Progress Notes (Signed)
Pt diaphoretic and nauseated.  C/o chest pain  4/10.   Doing ekg.  Called dr. Tresa Endo.

## 2012-11-09 NOTE — Progress Notes (Signed)
Pt c/o 10/10 chest pain and nausea; pt actively vomiting upon arrival to room. Pt given zofran as ordered. 12 lead EKG obtained with no apparent changes. VSS. Spoke with Dr. Tresa Endo (fellow on call). Order to increase nitro to (was currently at prior to arrival to floor). Order carried out. Pt currently with minimal chest pain (2/10) and resting more comfortably. Dr. Tresa Endo to review patient record and make further recommendations. Will continue to monitor closely.

## 2012-11-10 ENCOUNTER — Encounter (HOSPITAL_COMMUNITY)
Admission: EM | Disposition: A | Discharge status: Home / Self Care | Payer: Self-pay | Source: Home / Self Care | Attending: Cardiology

## 2012-11-10 DIAGNOSIS — I219 Acute myocardial infarction, unspecified: Secondary | ICD-10-CM

## 2012-11-10 DIAGNOSIS — I472 Ventricular tachycardia: Secondary | ICD-10-CM

## 2012-11-10 HISTORY — PX: LEFT HEART CATHETERIZATION WITH CORONARY ANGIOGRAM: SHX5451

## 2012-11-10 LAB — CBC
HCT: 42.8 % (ref 39.0–52.0)
MCH: 29.4 pg (ref 26.0–34.0)
MCHC: 34.6 g/dL (ref 30.0–36.0)
MCV: 84.5 fL (ref 78.0–100.0)
MCV: 85.1 fL (ref 78.0–100.0)
Platelets: 235 10*3/uL (ref 150–400)
RBC: 5.22 MIL/uL (ref 4.22–5.81)
RDW: 12.4 % (ref 11.5–15.5)
RDW: 12.7 % (ref 11.5–15.5)
WBC: 13.7 10*3/uL — ABNORMAL HIGH (ref 4.0–10.5)

## 2012-11-10 LAB — BASIC METABOLIC PANEL
BUN: 12 mg/dL (ref 6–23)
GFR calc Af Amer: >90 mL/min (ref >90)
GFR calc non Af Amer: >90 mL/min (ref >90)
Potassium: 4.2 mEq/L (ref 3.5–5.1)
Sodium: 133 mEq/L — ABNORMAL LOW (ref 135–145)

## 2012-11-10 LAB — HEPARIN LEVEL (UNFRACTIONATED)
Heparin Unfractionated: 0.11 IU/mL — ABNORMAL LOW (ref 0.30–0.70)
Heparin Unfractionated: 0.17 IU/mL — ABNORMAL LOW (ref 0.30–0.70)
Heparin Unfractionated: 0.43 IU/mL (ref 0.30–0.70)

## 2012-11-10 LAB — LIPID PANEL
HDL: 50 mg/dL (ref >39)
LDL Cholesterol: 92 mg/dL (ref 0–99)

## 2012-11-10 SURGERY — LEFT HEART CATHETERIZATION WITH CORONARY ANGIOGRAM
Anesthesia: LOCAL

## 2012-11-10 MED ORDER — SODIUM CHLORIDE 0.9 % IJ SOLN: 3.0000 mL | INTRAMUSCULAR | Status: DC | PRN

## 2012-11-10 MED ORDER — FENTANYL CITRATE 0.05 MG/ML IJ SOLN
INTRAMUSCULAR | Status: AC
  Filled 2012-11-10: qty 2

## 2012-11-10 MED ORDER — ASPIRIN 81 MG PO CHEW
324.0000 mg | CHEWABLE_TABLET | ORAL | Status: AC
  Administered 2012-11-10: 324 mg via ORAL
  Filled 2012-11-10: qty 4

## 2012-11-10 MED ORDER — SODIUM CHLORIDE 0.9 % IV SOLN: 250.0000 mL | INTRAVENOUS | Status: DC | PRN

## 2012-11-10 MED ORDER — HEPARIN SODIUM (PORCINE) 5000 UNIT/ML IJ SOLN
5000.0000 [IU] | Freq: Three times a day (TID) | INTRAMUSCULAR | Status: DC
  Administered 2012-11-11 – 2012-11-12 (×4): 5000 [IU] via SUBCUTANEOUS
  Filled 2012-11-10 (×7): qty 1

## 2012-11-10 MED ORDER — VERAPAMIL HCL 2.5 MG/ML IV SOLN
INTRAVENOUS | Status: AC
  Filled 2012-11-10: qty 2

## 2012-11-10 MED ORDER — MIDAZOLAM HCL 2 MG/2ML IJ SOLN
INTRAMUSCULAR | Status: AC
  Filled 2012-11-10: qty 2

## 2012-11-10 MED ORDER — SODIUM CHLORIDE 0.9 % IJ SOLN: 3.0000 mL | Freq: Two times a day (BID) | INTRAMUSCULAR | Status: DC

## 2012-11-10 MED ORDER — HEPARIN BOLUS VIA INFUSION
3000.0000 [IU] | Freq: Once | INTRAVENOUS | Status: AC
  Administered 2012-11-10: 3000 [IU] via INTRAVENOUS
  Filled 2012-11-10: qty 3000

## 2012-11-10 MED ORDER — SODIUM CHLORIDE 0.9 % IV SOLN
1.0000 mL/kg/h | INTRAVENOUS | Status: AC
  Administered 2012-11-10: 1 mL/kg/h via INTRAVENOUS

## 2012-11-10 MED ORDER — CARVEDILOL 3.125 MG PO TABS
3.1250 mg | ORAL_TABLET | Freq: Two times a day (BID) | ORAL | Status: DC
  Administered 2012-11-10 – 2012-11-11 (×2): 3.125 mg via ORAL
  Filled 2012-11-10 (×4): qty 1

## 2012-11-10 MED ORDER — PROMETHAZINE HCL 25 MG/ML IJ SOLN
12.5000 mg | Freq: Four times a day (QID) | INTRAMUSCULAR | Status: DC | PRN
  Administered 2012-11-10: 12.5 mg via INTRAVENOUS
  Filled 2012-11-10 (×2): qty 1

## 2012-11-10 MED ORDER — CLOPIDOGREL BISULFATE 75 MG PO TABS
75.0000 mg | ORAL_TABLET | Freq: Every day | ORAL | Status: DC
  Administered 2012-11-11 – 2012-11-12 (×2): 75 mg via ORAL
  Filled 2012-11-10 (×3): qty 1

## 2012-11-10 MED ORDER — OXYCODONE HCL 5 MG PO TABS
5.0000 mg | ORAL_TABLET | Freq: Four times a day (QID) | ORAL | Status: DC | PRN
  Administered 2012-11-10 – 2012-11-12 (×2): 5 mg via ORAL
  Filled 2012-11-10 (×2): qty 1

## 2012-11-10 MED ORDER — SODIUM CHLORIDE 0.9 % IV SOLN
INTRAVENOUS | Status: DC
  Administered 2012-11-10: 11:00:00 via INTRAVENOUS

## 2012-11-10 MED ORDER — METOPROLOL TARTRATE 12.5 MG HALF TABLET
12.5000 mg | ORAL_TABLET | Freq: Two times a day (BID) | ORAL | Status: DC
  Filled 2012-11-10 (×2): qty 1

## 2012-11-10 MED ORDER — HEPARIN (PORCINE) IN NACL 2-0.9 UNIT/ML-% IJ SOLN
INTRAMUSCULAR | Status: AC
  Filled 2012-11-10: qty 1000

## 2012-11-10 MED ORDER — LIDOCAINE HCL (PF) 1 % IJ SOLN
INTRAMUSCULAR | Status: AC
  Filled 2012-11-10: qty 30

## 2012-11-10 MED ORDER — ASPIRIN 81 MG PO CHEW: 324.0000 mg | CHEWABLE_TABLET | ORAL | Status: DC

## 2012-11-10 MED ORDER — POTASSIUM CHLORIDE 10 MEQ/100ML IV SOLN
10.0000 meq | INTRAVENOUS | Status: AC
  Administered 2012-11-10 (×2): 10 meq via INTRAVENOUS
  Filled 2012-11-10: qty 200

## 2012-11-10 MED ORDER — LISINOPRIL 2.5 MG PO TABS
2.5000 mg | ORAL_TABLET | Freq: Every day | ORAL | Status: DC
  Administered 2012-11-10 – 2012-11-11 (×2): 2.5 mg via ORAL
  Filled 2012-11-10 (×3): qty 1

## 2012-11-10 MED ORDER — ATORVASTATIN CALCIUM 80 MG PO TABS
80.0000 mg | ORAL_TABLET | Freq: Every day | ORAL | Status: DC
  Administered 2012-11-10 – 2012-11-15 (×6): 80 mg via ORAL
  Filled 2012-11-10 (×7): qty 1

## 2012-11-10 MED ORDER — AMLODIPINE BESYLATE 5 MG PO TABS
5.0000 mg | ORAL_TABLET | Freq: Every day | ORAL | Status: DC
  Administered 2012-11-10: 5 mg via ORAL
  Filled 2012-11-10: qty 1

## 2012-11-10 NOTE — Progress Notes (Signed)
Pt with polysubstance abuse with phentermine assoc STEMI with loss of R wave V1-3 and + troponin  Echo pending  Cath pending  ?spasm vs obstructive disease  On CCB and not BB until we get cath results Will need plavix for 30 d at least and if needs stent >>consider BMS for compliance  Spoke about life change, and the need for abstaining from marijuana and other drugs incl cigs taht can be assoc with spasm, particularly if that is found on cath  Needs desires PCP that accepts medicaid Will need help with anxiety disorder

## 2012-11-10 NOTE — Progress Notes (Signed)
Patient: Mitchell Stokes Date of Encounter: 11/10/2012, 8:55 AM Admit date: 11/09/2012     Subjective  Mr. Toso has no complaints currently. Events overnight noted. He denies CP or SOB. He denies palpitations.   Objective  Physical Exam: Vitals: BP 122/82  Pulse 79  Temp(Src) 98.1 F (36.7 C) (Oral)  Resp 12  Ht 5\' 8"  (1.727 m)  Wt 236 lb 6.4 oz (107.23 kg)  BMI 35.95 kg/m2  SpO2 97% General: Well developed, well appearing, sleepy 27 year old male in no acute distress. Neck: Supple. JVD not elevated. Lungs: Clear bilaterally to auscultation without wheezes, rales, or rhonchi. Breathing is unlabored. Heart: RRR S1 S2 without murmurs, rubs, or gallops.  Abdomen: Soft, non-distended. Extremities: No clubbing or cyanosis. No edema.  Distal pedal pulses are 2+ and equal bilaterally. Neuro: Alert and oriented X 3. Moves all extremities spontaneously. No focal deficits.  Intake/Output:  Intake/Output Summary (Last 24 hours) at 11/10/12 0855 Last data filed at 11/10/12 0700  Gross per 24 hour  Intake 625.31 ml  Output    400 ml  Net 225.31 ml    Inpatient Medications:  . amLODipine  2.5 mg Oral Daily  . aspirin EC  81 mg Oral Daily  . folic acid  1 mg Oral Daily  . multivitamin with minerals  1 tablet Oral Daily  . pantoprazole  40 mg Oral Daily  . thiamine  100 mg Oral Daily   Or  . thiamine  100 mg Intravenous Daily   . sodium chloride 20 mL/hr at 11/10/12 0600  . heparin 2,000 Units/hr (11/10/12 1610)  . nitroGLYCERIN 20 mcg/min (11/10/12 0600)    Labs:  Recent Labs  11/09/12 1637 11/10/12 0520  NA 136 133*  K 3.9 4.2  CL 101 100  CO2 23 20  GLUCOSE 102* 133*  BUN 12 12  CREATININE 1.01 0.96  CALCIUM 8.7 9.5  MG  --  1.9    Recent Labs  11/09/12 1637  AST 37  ALT 45  ALKPHOS 97  BILITOT 0.5  PROT 6.9  ALBUMIN 3.9    Recent Labs  11/09/12 2021  LIPASE 17  AMYLASE 46    Recent Labs  11/09/12 1310 11/10/12 0520  WBC 10.7* 13.7*   HGB 16.0 15.5  HCT 44.4 44.1  MCV 84.9 84.5  PLT 222 235    Recent Labs  11/09/12 1307 11/09/12 1637 11/09/12 2213 11/10/12 0352  TROPONINI <0.30 0.80* 4.66* 7.68*    Recent Labs  11/09/12 2021  HGBA1C 5.5    Recent Labs  11/10/12 0520  CHOL 210*  HDL 50  LDLCALC 92  TRIG 338*  CHOLHDL 4.2    Recent Labs  11/09/12 2021  TSH 0.315*    Recent Labs  11/09/12 1456  INR 0.90    Radiology/Studies: Dg Chest 2 View  11/09/2012   *RADIOLOGY REPORT*  Clinical Data: Chest pain and sweating.  CHEST - 2 VIEW  Comparison: Chest x-ray 07/21/2012.  Findings: Lung volumes are low.  There is some linear bibasilar opacities favored to reflect subsegmental atelectasis.  No definite acute consolidative airspace disease.  No pleural effusions.  No evidence of pulmonary edema.  Heart size is within normal limits. Mediastinal contours are unremarkable.  IMPRESSION: 1.  Low lung volumes with probable bibasilar subsegmental atelectasis.   Original Report Authenticated By: Trudie Reed, M.D.    Telemetry: SR with brief NSVT    Assessment and Plan  1. NSTEMI - troponin continues  to rise; continue IV NTG and IV heparin; plan for cardiac cath today; will order echo 2. NSVT - asymptomatic; reports "pounding and racing" recently since starting "diet pills" 3. Polysubstance abuse - marijuana and alcohol 4. HTN 5. Recent "diet pills" - started Fastin (phentermine) 3 days ago  Dr. Graciela Husbands to see Signed, Rick Duff PA-C

## 2012-11-10 NOTE — Interval H&P Note (Signed)
History and Physical Interval Note:  11/10/2012 2:30 PM  Mitchell Stokes  has presented today for surgery, with the diagnosis of cp  The various methods of treatment have been discussed with the patient and family. After consideration of risks, benefits and other options for treatment, the patient has consented to  Procedure(s): LEFT HEART CATHETERIZATION WITH CORONARY ANGIOGRAM (N/A) as a surgical intervention .  The patient's history has been reviewed, patient examined, no change in status, stable for surgery.  I have reviewed the patient's chart and labs.  Questions were answered to the patient's satisfaction.     Theron Arista [facility] 11/10/2012 2:30 PM

## 2012-11-10 NOTE — Progress Notes (Addendum)
Pt vomited approximately 100cc brown emesis. Questionable vomited up po ativan.  C/o chest pain again 4/10.  Called and notified dr. Leeann Must.  Also notified of third troponin being elevated and 25 beat run of v tach with which pt was asymptomatic.

## 2012-11-10 NOTE — Progress Notes (Addendum)
Pt had 25  beat run of wide QRS  tachycardia.  Pt asymptomatic. Continue to monitor.

## 2012-11-10 NOTE — Progress Notes (Signed)
ANTICOAGULATION CONSULT NOTE - Follow-Up Consult  Pharmacy Consult for Heparin Indication: chest pain/ACS  No Known Allergies  Patient Measurements: Height: 5\' 8"  (172.7 cm) Weight: 236 lb 6.4 oz (107.23 kg) IBW/kg (Calculated) : 68.4 Heparin Dosing Weight: 94.5kg  Vital Signs: Temp: 98 F (36.7 C) (06/09 1148) Temp src: Oral (06/09 1148) BP: 120/68 mmHg (06/09 1148) Pulse Rate: 70 (06/09 1148)  Labs:  Recent Labs  11/09/12 1307 11/09/12 1310 11/09/12 1456 11/09/12 1637 11/09/12 2213 11/10/12 0025 11/10/12 0352 11/10/12 0520 11/10/12 1131  HGB  --  16.0  --   --   --   --   --  15.5  --   HCT  --  44.4  --   --   --   --   --  44.1  --   PLT  --  222  --   --   --   --   --  235  --   APTT  --   --  26  --   --   --   --   --   --   LABPROT  --   --  12.1  --   --   --   --   --   --   INR  --   --  0.90  --   --   --   --   --   --   HEPARINUNFRC  --   --   --   --   --  0.11*  --  0.17* 0.43  CREATININE  --  1.10  --  1.01  --   --   --  0.96  --   TROPONINI <0.30  --   --  0.80* 4.66*  --  7.68*  --   --     Estimated Creatinine Clearance: 135.9 ml/min (by C-G formula based on Cr of 0.96).   Medical History: Past Medical History  Diagnosis Date  . HTN (hypertension)   . GERD (gastroesophageal reflux disease)     Medications:  . sodium chloride 20 mL/hr at 11/10/12 0600  . sodium chloride 75 mL/hr at 11/10/12 1036  . heparin 2,000 Units/hr (11/10/12 1610)  . nitroGLYCERIN 20 mcg/min (11/10/12 0600)    Assessment: 27 yom to start heparin for CP/ACS, EKG abnormalities, and positive troponins.  Heparin level is therapeutic on 2000 units/hr.  No bleeding or complications noted.    Goal of Therapy:  Heparin level 0.3-0.7 units/ml Monitor platelets by anticoagulation protocol: Yes   Plan:  1. Continue IV heparin at current rate. 2. Daily heparin level and CBC 3. F/u plans for heparin after cath lab.  Tad Moore, BCPS  Clinical  Pharmacist Pager [phone]  11/10/2012 1:46 PM

## 2012-11-10 NOTE — Progress Notes (Signed)
Notified cardiology on call of troponin 7.68

## 2012-11-10 NOTE — Progress Notes (Signed)
Pt c/o right arm pain from arterial site up to right shoulder and into chest.  Good pulses in  Right hand.  Pos . allens and pos reverse allens with dampened waveform.  Called and notified dr. Adolm Joseph.  Orders received.  Continue to monitor patient.

## 2012-11-10 NOTE — Progress Notes (Signed)
ANTICOAGULATION CONSULT NOTE - Follow Up Consult  Pharmacy Consult for heparin Indication: chest pain/ACS  Labs:  Recent Labs  11/09/12 1307 11/09/12 1310 11/09/12 1456 11/09/12 1637 11/09/12 2213 11/10/12 0025 11/10/12 0352 11/10/12 0520  HGB  --  16.0  --   --   --   --   --  15.5  HCT  --  44.4  --   --   --   --   --  44.1  PLT  --  222  --   --   --   --   --  235  APTT  --   --  26  --   --   --   --   --   LABPROT  --   --  12.1  --   --   --   --   --   INR  --   --  0.90  --   --   --   --   --   HEPARINUNFRC  --   --   --   --   --  0.11*  --  0.17*  CREATININE  --  1.10  --  1.01  --   --   --   --   TROPONINI <0.30  --   --  0.80* 4.66*  --  7.68*  --     Assessment: 27yo male remains subtherapeutic on heparin despite rebolus and rate increase with little rise in level though lab was drawn just 4hr after bolus and rate change; troponin continues to rise.  Goal of Therapy:  Heparin level 0.3-0.7 units/ml   Plan:  Will rebolus with heparin 3000 units x1 and increase gtt by 4 units/kg/hr to 2000 units/hr and check heparin level in 6hr.  Vernard Gambles, PharmD, BCPS  11/10/2012,6:19 AM

## 2012-11-10 NOTE — Progress Notes (Signed)
ANTICOAGULATION CONSULT NOTE - Follow Up Consult  Pharmacy Consult for heparin Indication: chest pain/ACS  Labs:  Recent Labs  11/09/12 1307 11/09/12 1310 11/09/12 1456 11/09/12 1637 11/09/12 2213 11/10/12 0025  HGB  --  16.0  --   --   --   --   HCT  --  44.4  --   --   --   --   PLT  --  222  --   --   --   --   APTT  --   --  26  --   --   --   LABPROT  --   --  12.1  --   --   --   INR  --   --  0.90  --   --   --   HEPARINUNFRC  --   --   --   --   --  0.11*  CREATININE  --  1.10  --  1.01  --   --   TROPONINI <0.30  --   --  0.80* 4.66*  --     Assessment: 27yo male subtherapeutic on heparin with initial dosing for ACS; troponin continues to rise.  Goal of Therapy:  Heparin level 0.3-0.7 units/ml   Plan:  Will rebolus with heparin 3000 units x1 and increase gtt by 4 units/kg/hr to 1600 units/hr and check heparin level in 6hr.  Vernard Gambles, PharmD, BCPS  11/10/2012,12:58 AM

## 2012-11-10 NOTE — CV Procedure (Signed)
   Cardiac Catheterization Procedure Note  Name: Mitchell Stokes MRN: [id number] DOB: 1986/03/07  Procedure: Left Heart Cath, Selective Coronary Angiography, LV angiography  Indication: 27 yo WM with HTN, hyperlipidemia, and polysubstance abuse ( Etoh, cannibis, and opiates) presents with a septal MI by serial Ecgs and troponin to 7.68.   Procedural Details: The right wrist was prepped, draped, and anesthetized with 1% lidocaine. Using the modified Seldinger technique, a 5 French sheath was introduced into the right radial artery. 3 mg of verapamil was administered through the sheath, weight-based unfractionated heparin was administered intravenously.  There was significant radial artery spasm that improved with additional Verapamil IA. Standard Judkins catheters were used for selective coronary angiography and left ventriculography. Catheter exchanges were performed over an exchange length guidewire. There were no immediate procedural complications. A TR band was used for radial hemostasis at the completion of the procedure.  The patient was transferred to the post catheterization recovery area for further monitoring.  Procedural Findings: Hemodynamics: AO 109/87 mean 98 mm Hg LV 116/20 mm Hg  Coronary angiography: Coronary dominance: right  Left mainstem: Normal  Left anterior descending (LAD): The LAD is flush occluded at the ostium. There are right to left collaterals from the RCA via the proximal septal perforators. There are also faint left to left collaterals to the distal LAD.  Ramus intermediate: This is a large vessel and is normal.  Left circumflex (LCx): Normal  Right coronary artery (RCA): Normal  Left ventriculography: Left ventricular systolic function is abnormal, There is severe hypokinesis/ akinesis of the mid anterior wall with mild hypokinesis of the distal anterior wall and apex. LVEF is estimated at 40%, there is no significant mitral regurgitation   Final  Conclusions:   1. Single vessel occlusive coronary artery disease with flush occlusion of the ostial LAD 2. Moderate LV dysfunction.  Recommendations: Maximize medical therapy with beta blocker, ACEi, nitrates, and antiplatelet therapy with ASA/ Plavix. Check Echo. Statin therapy. If patient has recurrent angina despite medical therapy will need to consider coronary intervention. PCI would be complex given ostial LAD occlusion and ambivalent proximal cap. Other option would be LIMA to LAD. Patient needs lifestyle modification with cessation of polysubstance abuse and compliance with medical regimen.  Theron Arista [facility] 11/10/2012, 3:34 PM

## 2012-11-10 NOTE — Care Management Note (Signed)
    Page 1 of 1   11/10/2012     11:07:45 AM   CARE MANAGEMENT NOTE 11/10/2012  Patient:  Mitchell Stokes, Mitchell Stokes   Account Number:  [id number]  Date Initiated:  11/10/2012  Documentation initiated by:  Junius Creamer  Subjective/Objective Assessment:   adm w ch pain, htn     Action/Plan:   lives w fam   Anticipated DC Date:     Anticipated DC Plan:        DC Planning Services  CM consult      Choice offered to / List presented to:             Status of service:   Medicare Important Message given?   (If response is "NO", the following Medicare IM given date fields will be blank) Date Medicare IM given:   Date Additional Medicare IM given:    Discharge Disposition:    Per UR Regulation:  Reviewed for med. necessity/level of care/duration of stay  If discussed at Long Length of Stay Meetings, dates discussed:    Comments:

## 2012-11-11 DIAGNOSIS — E782 Mixed hyperlipidemia: Secondary | ICD-10-CM | POA: Diagnosis present

## 2012-11-11 DIAGNOSIS — I2109 ST elevation (STEMI) myocardial infarction involving other coronary artery of anterior wall: Secondary | ICD-10-CM

## 2012-11-11 DIAGNOSIS — I10 Essential (primary) hypertension: Secondary | ICD-10-CM

## 2012-11-11 DIAGNOSIS — I517 Cardiomegaly: Secondary | ICD-10-CM

## 2012-11-11 DIAGNOSIS — F1910 Other psychoactive substance abuse, uncomplicated: Secondary | ICD-10-CM | POA: Diagnosis present

## 2012-11-11 DIAGNOSIS — I519 Heart disease, unspecified: Secondary | ICD-10-CM

## 2012-11-11 LAB — BASIC METABOLIC PANEL
BUN: 13 mg/dL (ref 6–23)
Calcium: 9.1 mg/dL (ref 8.4–10.5)
GFR calc non Af Amer: >90 mL/min (ref >90)
Glucose, Bld: 95 mg/dL (ref 70–99)

## 2012-11-11 LAB — CBC
HCT: 44.3 % (ref 39.0–52.0)
Hemoglobin: 15 g/dL (ref 13.0–17.0)
MCH: 29.4 pg (ref 26.0–34.0)
MCHC: 33.9 g/dL (ref 30.0–36.0)

## 2012-11-11 MED ORDER — CARVEDILOL 6.25 MG PO TABS
6.2500 mg | ORAL_TABLET | Freq: Two times a day (BID) | ORAL | Status: DC
  Administered 2012-11-11 – 2012-11-15 (×8): 6.25 mg via ORAL
  Filled 2012-11-11 (×11): qty 1

## 2012-11-11 MED ORDER — FENTANYL CITRATE 0.05 MG/ML IJ SOLN
INTRAMUSCULAR | Status: AC
  Filled 2012-11-11: qty 2

## 2012-11-11 MED ORDER — ISOSORBIDE MONONITRATE ER 30 MG PO TB24
30.0000 mg | ORAL_TABLET | Freq: Every day | ORAL | Status: DC
  Administered 2012-11-11 – 2012-11-13 (×2): 30 mg via ORAL
  Filled 2012-11-11 (×4): qty 1

## 2012-11-11 NOTE — Progress Notes (Signed)
CARDIAC REHAB PHASE I   PRE:  Rate/Rhythm: 116ST  BP:  Supine: 138/71  Sitting:   Standing:    SaO2: 96%RA  MODE:  Ambulation: 350 ft   POST:  Rate/Rhythm: 100 SR  BP:  Supine:   Sitting: 132/82  Standing:    SaO2: 97%RA 1255-1338 Pt with 3 children and 3 adults in room. Pt walked 350 ft on RA with hand held asst with family walking with [place]. He denied CP or tightness. C/o headache and slight dizziness by end of walk. BP stable. Told pt he may be light headed from lying in bed so put in recliner. Call bell in reach. Left MI booklet. Will educate at later time when pt with less family in room. Too distracting at this time and pt seems very tired.   Luetta Nutting, RN BSN  11/11/2012 1:31 PM

## 2012-11-11 NOTE — Progress Notes (Signed)
Patient: Mitchell Stokes Date of Encounter: 11/11/2012, 7:00 AM Admit date: 11/09/2012     Subjective  Mitchell Stokes has no complaints currently. He denies CP or SOB. He denies palpitations.   Objective  Physical Exam: Vitals: BP 103/59  Pulse 72  Temp(Src) 98.6 F (37 C) (Oral)  Resp 17  Ht 5\' 8"  (1.727 m)  Wt 236 lb 6.4 oz (107.23 kg)  BMI 35.95 kg/m2  SpO2 95% General: Well developed, well appearing, sleepy 27 year old male in no acute distress. Neck: Supple. JVD not elevated. Lungs: Clear bilaterally to auscultation without wheezes, rales, or rhonchi. Breathing is unlabored. Heart: RRR S1 S2 without murmurs, rubs, or gallops.  Abdomen: Soft, non-distended. Extremities: No clubbing or cyanosis. No edema.  Distal pedal pulses are 2+ and equal bilaterally. Right wrist site intact without bleeding or hematoma.  Neuro: Alert and oriented X 3. Moves all extremities spontaneously. No focal deficits.  Intake/Output:  Intake/Output Summary (Last 24 hours) at 11/11/12 0700 Last data filed at 11/10/12 2300  Gross per 24 hour  Intake 1910.1 ml  Output   1175 ml  Net  735.1 ml    Inpatient Medications:  . aspirin EC  81 mg Oral Daily  . atorvastatin  80 mg Oral q1800  . carvedilol  3.125 mg Oral BID WC  . clopidogrel  75 mg Oral Q breakfast  . folic acid  1 mg Oral Daily  . heparin  5,000 Units Subcutaneous Q8H  . lisinopril  2.5 mg Oral Daily  . multivitamin with minerals  1 tablet Oral Daily  . pantoprazole  40 mg Oral Daily  . thiamine  100 mg Oral Daily   Or  . thiamine  100 mg Intravenous Daily   . sodium chloride 20 mL/hr at 11/10/12 0600  . nitroGLYCERIN Stopped (11/10/12 2305)    Labs:  Recent Labs  11/09/12 1637 11/10/12 0520 11/10/12 1738 11/11/12 0440  NA 136 133*  --  137  K 3.9 4.2  --  3.8  CL 101 100  --  103  CO2 23 20  --  24  GLUCOSE 102* 133*  --  95  BUN 12 12  --  13  CREATININE 1.01 0.96 0.99 1.05  CALCIUM 8.7 9.5  --  9.1  MG  --   1.9  --   --     Recent Labs  11/09/12 1637  AST 37  ALT 45  ALKPHOS 97  BILITOT 0.5  PROT 6.9  ALBUMIN 3.9    Recent Labs  11/09/12 2021  LIPASE 17  AMYLASE 46    Recent Labs  11/10/12 1738 11/11/12 0440  WBC 10.8* 9.7  HGB 14.8 15.0  HCT 42.8 44.3  MCV 85.1 86.9  PLT 203 196    Recent Labs  11/09/12 1307 11/09/12 1637 11/09/12 2213 11/10/12 0352  TROPONINI <0.30 0.80* 4.66* 7.68*    Recent Labs  11/09/12 2021  HGBA1C 5.5    Recent Labs  11/10/12 0520  CHOL 210*  HDL 50  LDLCALC 92  TRIG 338*  CHOLHDL 4.2    Recent Labs  11/09/12 2021  TSH 0.315*    Recent Labs  11/09/12 1456  INR 0.90    Radiology/Studies: Dg Chest 2 View  11/09/2012   *RADIOLOGY REPORT*  Clinical Data: Chest pain and sweating.  CHEST - 2 VIEW  Comparison: Chest x-ray 07/21/2012.  Findings: Lung volumes are low.  There is some linear bibasilar opacities favored to  reflect subsegmental atelectasis.  No definite acute consolidative airspace disease.  No pleural effusions.  No evidence of pulmonary edema.  Heart size is within normal limits. Mediastinal contours are unremarkable.  IMPRESSION: 1.  Low lung volumes with probable bibasilar subsegmental atelectasis.   Original Report Authenticated By: Trudie Reed, M.D.    Cardiac catheterization yesterday Procedural Findings:  Hemodynamics:  AO 109/87 mean 98 mm Hg  LV 116/20 mm Hg  Coronary angiography:  Coronary dominance: right  Left mainstem: Normal  Left anterior descending (LAD): The LAD is flush occluded at the ostium. There are right to left collaterals from the RCA via the proximal septal perforators. There are also faint left to left collaterals to the distal LAD.  Ramus intermediate: This is a large vessel and is normal.  Left circumflex (LCx): Normal  Right coronary artery (RCA): Normal  Left ventriculography: Left ventricular systolic function is abnormal, There is severe hypokinesis/ akinesis of the  mid anterior wall with mild hypokinesis of the distal anterior wall and apex. LVEF is estimated at 40%, there is no significant mitral regurgitation  Final Conclusions:  1. Single vessel occlusive coronary artery disease with flush occlusion of the ostial LAD  2. Moderate LV dysfunction.  Recommendations: Maximize medical therapy with beta blocker, ACEi, nitrates, and antiplatelet therapy with ASA/ Plavix. Check Echo. Statin therapy. If patient has recurrent angina despite medical therapy will need to consider coronary intervention. PCI would be complex given ostial LAD occlusion and ambivalent proximal cap. Other option would be LIMA to LAD. Patient needs lifestyle modification with cessation of polysubstance abuse and compliance with medical regimen.   Telemetry: SR; no arrhythmias    Assessment and Plan  1. NSTEMI - cardiac cath yesterday shows occluded ostial LAD; continue medical therapy and aggressive risk factor modification; see Dr. Elvis Coil recs above 2. 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 NSVT - asymptomatic; no recurrence in last 24 hours 4. Polysubstance abuse - opiates, marijuana and alcohol; cessation advised 5. HTN 6. Recently started Fastin (phentermine) - needs to stop  Dr. [place] to see Signed, Rick Duff PA-C Patient seen and examined and history reviewed. Agree with above findings and plan. Rare PVCs noted on monitor. Patient is without recurrent chest pain or SOB. Some soreness at right radial cath site but no hematoma and good pulses. Ecg shows evolving anteroseptal infarct with LAD and septal Q waves. T wave inversion is new. Echo is pending. Will increase Coreg dose today. Ambulate in unit with cardiac Rehab. If recurrent chest pain will need to consider revascularization. If no chest pain on medical therapy will consider early stress perfusion study. Discussed with patient his substance abuse. He states he has not used opiates or cocaine in  over one month. Mostly using cannabis and Etoh. Thinks he can stop on his own. No diet drugs recommended. Offered mental health referral if he needs it. Stressed importance of compliance with medications.  Theron Arista JordanMD 11/11/2012 9:09 AM

## 2012-11-12 ENCOUNTER — Encounter (HOSPITAL_COMMUNITY)
Admission: EM | Disposition: A | Discharge status: Home / Self Care | Payer: Self-pay | Source: Home / Self Care | Attending: Cardiology

## 2012-11-12 DIAGNOSIS — I219 Acute myocardial infarction, unspecified: Secondary | ICD-10-CM

## 2012-11-12 DIAGNOSIS — I2119 ST elevation (STEMI) myocardial infarction involving other coronary artery of inferior wall: Secondary | ICD-10-CM

## 2012-11-12 DIAGNOSIS — I2510 Atherosclerotic heart disease of native coronary artery without angina pectoris: Secondary | ICD-10-CM

## 2012-11-12 HISTORY — PX: PERCUTANEOUS CORONARY STENT INTERVENTION (PCI-S): SHX5485

## 2012-11-12 HISTORY — PX: LEFT HEART CATHETERIZATION WITH CORONARY ANGIOGRAM: SHX5451

## 2012-11-12 LAB — BASIC METABOLIC PANEL
Calcium: 8.9 mg/dL (ref 8.4–10.5)
Creatinine, Ser: 1.17 mg/dL (ref 0.50–1.35)
GFR calc non Af Amer: 84 mL/min — ABNORMAL LOW (ref >90)
Glucose, Bld: 90 mg/dL (ref 70–99)
Sodium: 137 mEq/L (ref 135–145)

## 2012-11-12 LAB — CBC
Hemoglobin: 14.6 g/dL (ref 13.0–17.0)
MCH: 29.5 pg (ref 26.0–34.0)
MCHC: 33.7 g/dL (ref 30.0–36.0)
MCV: 87.5 fL (ref 78.0–100.0)
Platelets: 192 10*3/uL (ref 150–400)

## 2012-11-12 LAB — TROPONIN I: Troponin I: 19.3 ng/mL (ref <0.30)

## 2012-11-12 LAB — PLATELET INHIBITION P2Y12: Platelet Function  P2Y12: 214 [PRU] (ref 194–418)

## 2012-11-12 LAB — POCT ACTIVATED CLOTTING TIME: Activated Clotting Time: 361 seconds

## 2012-11-12 SURGERY — LEFT HEART CATHETERIZATION WITH CORONARY ANGIOGRAM
Anesthesia: LOCAL

## 2012-11-12 SURGERY — PERCUTANEOUS CORONARY STENT INTERVENTION (PCI-S)
Anesthesia: LOCAL

## 2012-11-12 MED ORDER — FENTANYL CITRATE 0.05 MG/ML IJ SOLN
INTRAMUSCULAR | Status: AC
  Filled 2012-11-12: qty 2

## 2012-11-12 MED ORDER — PRASUGREL HCL 10 MG PO TABS: 10.0000 mg | ORAL_TABLET | Freq: Every day | ORAL | Status: DC

## 2012-11-12 MED ORDER — NITROGLYCERIN IN D5W 200-5 MCG/ML-% IV SOLN
2.0000 ug/min | INTRAVENOUS | Status: DC
  Administered 2012-11-12: 10 ug/min via INTRAVENOUS

## 2012-11-12 MED ORDER — SODIUM CHLORIDE 0.9 % IV SOLN
0.2500 mg/kg/h | INTRAVENOUS | Status: AC
  Administered 2012-11-12: 0.25 mg/kg/h via INTRAVENOUS
  Filled 2012-11-12: qty 250

## 2012-11-12 MED ORDER — HEPARIN BOLUS VIA INFUSION
3000.0000 [IU] | Freq: Once | INTRAVENOUS | Status: AC
  Administered 2012-11-12: 3000 [IU] via INTRAVENOUS
  Filled 2012-11-12: qty 3000

## 2012-11-12 MED ORDER — BIVALIRUDIN 250 MG IV SOLR
INTRAVENOUS | Status: AC
  Filled 2012-11-12: qty 250

## 2012-11-12 MED ORDER — SODIUM CHLORIDE 0.9 % IV SOLN: 1.0000 mL/kg/h | INTRAVENOUS | Status: DC

## 2012-11-12 MED ORDER — DIAZEPAM 5 MG PO TABS
5.0000 mg | ORAL_TABLET | ORAL | Status: AC
  Administered 2012-11-12: 5 mg via ORAL
  Filled 2012-11-12: qty 1

## 2012-11-12 MED ORDER — PRASUGREL HCL 10 MG PO TABS
ORAL_TABLET | ORAL | Status: AC
  Administered 2012-11-13: 10 mg via ORAL
  Filled 2012-11-12: qty 6

## 2012-11-12 MED ORDER — NITROGLYCERIN 0.2 MG/ML ON CALL CATH LAB
INTRAVENOUS | Status: AC
  Filled 2012-11-12: qty 1

## 2012-11-12 MED ORDER — LIDOCAINE HCL (PF) 1 % IJ SOLN
INTRAMUSCULAR | Status: AC
  Filled 2012-11-12: qty 30

## 2012-11-12 MED ORDER — LIDOCAINE HCL (PF) 1 % IJ SOLN
INTRAMUSCULAR | Status: AC
Start: 2012-11-12 — End: 2012-11-12
  Filled 2012-11-12: qty 30

## 2012-11-12 MED ORDER — HEPARIN (PORCINE) IN NACL 100-0.45 UNIT/ML-% IJ SOLN: 2000.0000 [IU]/h | INTRAMUSCULAR | Status: DC

## 2012-11-12 MED ORDER — ONDANSETRON HCL 4 MG/2ML IJ SOLN: 4.0000 mg | Freq: Four times a day (QID) | INTRAMUSCULAR | Status: DC | PRN

## 2012-11-12 MED ORDER — EPTIFIBATIDE 75 MG/100ML IV SOLN
INTRAVENOUS | Status: AC
  Administered 2012-11-13: 2.006 ug/kg/min via INTRAVENOUS
  Filled 2012-11-12: qty 100

## 2012-11-12 MED ORDER — PRASUGREL HCL 10 MG PO TABS
10.0000 mg | ORAL_TABLET | Freq: Every day | ORAL | Status: DC
  Administered 2012-11-14 – 2012-11-16 (×3): 10 mg via ORAL
  Filled 2012-11-12 (×4): qty 1

## 2012-11-12 MED ORDER — ASPIRIN 81 MG PO CHEW: 324.0000 mg | CHEWABLE_TABLET | ORAL | Status: AC

## 2012-11-12 MED ORDER — MIDAZOLAM HCL 2 MG/2ML IJ SOLN
INTRAMUSCULAR | Status: AC
  Filled 2012-11-12: qty 2

## 2012-11-12 MED ORDER — ASPIRIN 81 MG PO CHEW
CHEWABLE_TABLET | ORAL | Status: AC
  Administered 2012-11-12: 324 mg via ORAL
  Filled 2012-11-12: qty 4

## 2012-11-12 MED ORDER — HEPARIN (PORCINE) IN NACL 2-0.9 UNIT/ML-% IJ SOLN
INTRAMUSCULAR | Status: AC
  Filled 2012-11-12: qty 1000

## 2012-11-12 MED ORDER — SODIUM CHLORIDE 0.9 % IV SOLN
INTRAVENOUS | Status: DC
  Administered 2012-11-12 – 2012-11-14 (×3): via INTRAVENOUS

## 2012-11-12 MED ORDER — ACETAMINOPHEN 325 MG PO TABS
650.0000 mg | ORAL_TABLET | ORAL | Status: DC | PRN
  Administered 2012-11-13: 650 mg via ORAL

## 2012-11-12 MED ORDER — MORPHINE SULFATE 2 MG/ML IJ SOLN
2.0000 mg | Freq: Once | INTRAMUSCULAR | Status: AC
  Administered 2012-11-12: 2 mg via INTRAVENOUS

## 2012-11-12 MED ORDER — FUROSEMIDE 10 MG/ML IJ SOLN
40.0000 mg | Freq: Once | INTRAMUSCULAR | Status: AC
  Administered 2012-11-12: 40 mg via INTRAVENOUS

## 2012-11-12 MED ORDER — NITROGLYCERIN IN D5W 200-5 MCG/ML-% IV SOLN
10.0000 ug/min | INTRAVENOUS | Status: DC
  Administered 2012-11-12: 10 ug/min via INTRAVENOUS
  Filled 2012-11-12: qty 250

## 2012-11-12 MED ORDER — FUROSEMIDE 10 MG/ML IJ SOLN
INTRAMUSCULAR | Status: AC
  Filled 2012-11-12: qty 4

## 2012-11-12 MED ORDER — HEPARIN (PORCINE) IN NACL 100-0.45 UNIT/ML-% IJ SOLN
INTRAMUSCULAR | Status: AC
  Administered 2012-11-12: 2000 [IU]/h via INTRAVENOUS
  Filled 2012-11-12: qty 250

## 2012-11-12 MED ORDER — LISINOPRIL 5 MG PO TABS
5.0000 mg | ORAL_TABLET | Freq: Every day | ORAL | Status: DC
  Administered 2012-11-12 – 2012-11-16 (×5): 5 mg via ORAL
  Filled 2012-11-12 (×5): qty 1

## 2012-11-12 MED ORDER — ASPIRIN EC 81 MG PO TBEC: 81.0000 mg | DELAYED_RELEASE_TABLET | Freq: Every day | ORAL | Status: DC

## 2012-11-12 MED ORDER — EPTIFIBATIDE 75 MG/100ML IV SOLN
2.0000 ug/kg/min | INTRAVENOUS | Status: AC
  Administered 2012-11-12 – 2012-11-14 (×5): 2 ug/kg/min via INTRAVENOUS
  Filled 2012-11-12 (×11): qty 100

## 2012-11-12 MED ORDER — SODIUM CHLORIDE 0.9 % IV SOLN: INTRAVENOUS | Status: AC

## 2012-11-12 NOTE — Progress Notes (Signed)
Pt still having chest pain 2/10 with shortness of breath, and nausea/vomiting.  Ward Givens here to assess pt.  Will continue to monitor pt closely.

## 2012-11-12 NOTE — CV Procedure (Signed)
   CARDIAC CATH NOTE  Name: Mitchell Stokes MRN: [id number] DOB: 01-18-1986  Procedure: PTCA and stenting of the ostial LAD.   Indication: Unstable angina post inferior ST elevation myocardial infarction. The patient had cardiac catheterization done on Monday which showed flush occlusion of the LAD. The plan was to attempt medical therapy. However, this morning he started having significant substernal chest discomfort and was thus referred for urgent PCI.   Medications:  Sedation:  4 mg IV Versed, 75 mcg IV Fentanyl  Contrast:  185 mL Omnipaque  Procedural Details: The right groin was prepped, draped, and anesthetized with 1% lidocaine. Using the modified Seldinger technique, a 6 Fr sheath was introduced into the right femoral artery.  Weight-based bivalirudin was given for anticoagulation. Once a therapeutic ACT was achieved, a 6 [place] XB LAD 3.5 guide catheter was inserted. An XB LAD 4 was attempted first but did not fit. A Runthrough coronary guidewire cross the lesion relatively easily.  The lesion was predilated with a 2.5 x 12 mm balloon with multiple inflations. However, there continued to be poor flow in the vessel. I thus used a 3 mm x 20 compliant balloon with prolonged inflation. This established TIMI 3 flow. He was then given 60 mg of Effient. The leion was then stented with a 3.0 x 23 mm Xience stent.  The stent was postdilated with a 3.5 x 20 noncompliant balloon.  Following PCI, there was 0% residual stenosis and TIMI-3 flow. Final angiography confirmed an excellent result. During balloon inflations, he was noted to have distal embolization to OM 3 with significant chest discomfort. Thus, I decided to cross OM3 with a prowater wire. However, it appears that the distal distribution was relatively small and could not send the wire very distally. I decided that the best option is to treat this medically.  a. Femoral hemostasis was achieved with a Mynx closure device. The patient was  transferred to the post catheterization recovery area for further monitoring.  PCI Data: Vessel -  ostial/proximal LAD /Segment -  12  Percent Stenosis (pre)   100% TIMI-flow  0  Stent  3.0 x 23 mm  Xience drug-eluting stent postdilated with a 3.5 noncompliant balloon  Percent Stenosis (post)  0% TIMI-flow (post) 3   Final Conclusions:  1. Successful angioplasty and drug-eluting stent placement to the occluded ostial LAD.   2. The procedure was complicated by distal embolization to OM 3. I attempted establishing flow through this vessel. However, the wire did not advance distally likely due to small distribution.  Recommendations:  Recommend dual antiplatelet therapy for at least one year. Aggressive medical therapy is recommended for CAD. Consider a wearable defibrillator before hospital discharge.   Lorine Bears MD, [facility] 11/12/2012, 12:03 PM

## 2012-11-12 NOTE — H&P (View-Only) (Signed)
 TELEMETRY: Reviewed telemetry pt in NSR: Filed Vitals:   11/11/12 2100 11/11/12 2227 11/11/12 2326 11/12/12 0435  BP:  114/57 104/66 109/54  Pulse: 75 84 82 75  Temp:  98.7 F (37.1 C)  98.7 F (37.1 C)  TempSrc:      Resp:      Height:      Weight:      SpO2: 94% 94% 93% 93%    Intake/Output Summary (Last 24 hours) at 11/12/12 0703 Last data filed at 11/11/12 2300  Gross per 24 hour  Intake    550 ml  Output    800 ml  Net   -250 ml    SUBJECTIVE Patient denies any anginal pain. No SOB. Has some chest wall soreness. Anxiety under good control.  LABS: Basic Metabolic Panel:  Recent Labs  11/09/12 1637 11/10/12 0520  11/11/12 0440 11/12/12 0440  NA 136 133*  --  137 137  K 3.9 4.2  --  3.8 3.5  CL 101 100  --  103 99  CO2 23 20  --  24 27  GLUCOSE 102* 133*  --  95 90  BUN 12 12  --  13 16  CREATININE 1.01 0.96  < > 1.05 1.17  CALCIUM 8.7 9.5  --  9.1 8.9  MG  --  1.9  --   --   --   < > = values in this interval not displayed. Liver Function Tests:  Recent Labs  11/09/12 1637  AST 37  ALT 45  ALKPHOS 97  BILITOT 0.5  PROT 6.9  ALBUMIN 3.9    Recent Labs  11/09/12 2021  LIPASE 17  AMYLASE 46   CBC:  Recent Labs  11/11/12 0440 11/12/12 0440  WBC 9.7 10.4  HGB 15.0 14.6  HCT 44.3 43.3  MCV 86.9 87.5  PLT 196 192   Cardiac Enzymes:  Recent Labs  11/09/12 1637 11/09/12 2213 11/10/12 0352  TROPONINI 0.80* 4.66* 7.68*   BNP: No components found with this basename: POCBNP,  D-Dimer: No results found for this basename: DDIMER,  in the last 72 hours Hemoglobin A1C:  Recent Labs  11/09/12 2021  HGBA1C 5.5   Fasting Lipid Panel:  Recent Labs  11/10/12 0520  CHOL 210*  HDL 50  LDLCALC 92  TRIG 338*  CHOLHDL 4.2   Thyroid Function Tests:  Recent Labs  11/09/12 2021  TSH 0.315*     Radiology/Studies:  Dg Chest 2 View  11/09/2012   *RADIOLOGY REPORT*  Clinical Data: Chest pain and sweating.  CHEST - 2 VIEW   Comparison: Chest x-ray 07/21/2012.  Findings: Lung volumes are low.  There is some linear bibasilar opacities favored to reflect subsegmental atelectasis.  No definite acute consolidative airspace disease.  No pleural effusions.  No evidence of pulmonary edema.  Heart size is within normal limits. Mediastinal contours are unremarkable.  IMPRESSION: 1.  Low lung volumes with probable bibasilar subsegmental atelectasis.   Original Report Authenticated By: Daniel Entrikin, M.D.   Echo: Study Conclusions  - Left ventricle: The cavity size was mildly dilated. Wall thickness was increased in a pattern of mild LVH. Systolic function was moderately to severely reduced. The estimated ejection fraction was in the range of 30% to 35%. Akinesis of the mid-distalanteroseptal and apical myocardium. - Left atrium: The atrium was mildly dilated.   PHYSICAL EXAM General: Well developed, obese, in no acute distress. Head: Normocephalic, atraumatic, sclera non-icteric, no xanthomas, nares are without discharge.   Neck: Negative for carotid bruits. JVD not elevated. Lungs: Clear bilaterally to auscultation without wheezes, rales, or rhonchi. Breathing is unlabored. Heart: RRR S1 S2 without murmurs, rubs, or gallops. Mild chest wall tenderness to palpation. Abdomen: Soft, non-tender, non-distended with normoactive bowel sounds. No hepatomegaly. No rebound/guarding.  Msk:  Strength and tone appears normal for age. Extremities: No clubbing, cyanosis or edema.  Distal pedal pulses are 2+ and equal bilaterally. Neuro: Alert and oriented X 3. Moves all extremities spontaneously. Psych:  Responds to questions appropriately with a normal affect.  ASSESSMENT AND PLAN: 1. STEMI- anterior with flush occlusion of the ostial LAD. Currently without recurrent angina with ambulation. Will continue to optimize medical Rx. Transfer to telemetry today. Ambulate.  2. Ischemic cardiomyopathy with EF 30-35% by Echo. Adjust beta  blocker and ACEi. No overt Chf. May need to consider life vest prior to DC. 3. NSVT no recurrence for 48 hours. 4. HTN controlled. 5. Polysubstance abuse. No withdrawal sx.  Principal Problem:   ST elevation myocardial infarction (STEMI) of anterior wall Active Problems:   HTN (hypertension)   Combined hyperlipidemia   Polysubstance abuse   LV dysfunction    Signed, Peter Jordan MD,FACC 11/12/2012 7:09 AM    

## 2012-11-12 NOTE — Progress Notes (Signed)
TELEMETRY: Reviewed telemetry pt in NSR: Filed Vitals:   11/11/12 2100 11/11/12 2227 11/11/12 2326 11/12/12 0435  BP:  114/57 104/66 109/54  Pulse: 75 84 82 75  Temp:  98.7 F (37.1 C)  98.7 F (37.1 C)  TempSrc:      Resp:      Height:      Weight:      SpO2: 94% 94% 93% 93%    Intake/Output Summary (Last 24 hours) at 11/12/12 0703 Last data filed at 11/11/12 2300  Gross per 24 hour  Intake    550 ml  Output    800 ml  Net   -250 ml    SUBJECTIVE Patient denies any anginal pain. No SOB. Has some chest wall soreness. Anxiety under good control.  LABS: Basic Metabolic Panel:  Recent Labs  40/98/11 1637 11/10/12 0520  11/11/12 0440 11/12/12 0440  NA 136 133*  --  137 137  K 3.9 4.2  --  3.8 3.5  CL 101 100  --  103 99  CO2 23 20  --  24 27  GLUCOSE 102* 133*  --  95 90  BUN 12 12  --  13 16  CREATININE 1.01 0.96  < > 1.05 1.17  CALCIUM 8.7 9.5  --  9.1 8.9  MG  --  1.9  --   --   --   < > = values in this interval not displayed. Liver Function Tests:  Recent Labs  11/09/12 1637  AST 37  ALT 45  ALKPHOS 97  BILITOT 0.5  PROT 6.9  ALBUMIN 3.9    Recent Labs  11/09/12 2021  LIPASE 17  AMYLASE 46   CBC:  Recent Labs  11/11/12 0440 11/12/12 0440  WBC 9.7 10.4  HGB 15.0 14.6  HCT 44.3 43.3  MCV 86.9 87.5  PLT 196 192   Cardiac Enzymes:  Recent Labs  11/09/12 1637 11/09/12 2213 11/10/12 0352  TROPONINI 0.80* 4.66* 7.68*   BNP: No components found with this basename: POCBNP,  D-Dimer: No results found for this basename: DDIMER,  in the last 72 hours Hemoglobin A1C:  Recent Labs  11/09/12 2021  HGBA1C 5.5   Fasting Lipid Panel:  Recent Labs  11/10/12 0520  CHOL 210*  HDL 50  LDLCALC 92  TRIG 338*  CHOLHDL 4.2   Thyroid Function Tests:  Recent Labs  11/09/12 2021  TSH 0.315*     Radiology/Studies:  Dg Chest 2 View  11/09/2012   *RADIOLOGY REPORT*  Clinical Data: Chest pain and sweating.  CHEST - 2 VIEW   Comparison: Chest x-ray 07/21/2012.  Findings: Lung volumes are low.  There is some linear bibasilar opacities favored to reflect subsegmental atelectasis.  No definite acute consolidative airspace disease.  No pleural effusions.  No evidence of pulmonary edema.  Heart size is within normal limits. Mediastinal contours are unremarkable.  IMPRESSION: 1.  Low lung volumes with probable bibasilar subsegmental atelectasis.   Original Report Authenticated By: Trudie Reed, M.D.   Echo: Study Conclusions  - Left ventricle: The cavity size was mildly dilated. Wall thickness was increased in a pattern of mild LVH. Systolic function was moderately to severely reduced. The estimated ejection fraction was in the range of 30% to 35%. Akinesis of the mid-distalanteroseptal and apical myocardium. - Left atrium: The atrium was mildly dilated.   PHYSICAL EXAM General: Well developed, obese, in no acute distress. Head: Normocephalic, atraumatic, sclera non-icteric, no xanthomas, nares are without discharge.  Neck: Negative for carotid bruits. JVD not elevated. Lungs: Clear bilaterally to auscultation without wheezes, rales, or rhonchi. Breathing is unlabored. Heart: RRR S1 S2 without murmurs, rubs, or gallops. Mild chest wall tenderness to palpation. Abdomen: Soft, non-tender, non-distended with normoactive bowel sounds. No hepatomegaly. No rebound/guarding.  Msk:  Strength and tone appears normal for age. Extremities: No clubbing, cyanosis or edema.  Distal pedal pulses are 2+ and equal bilaterally. Neuro: Alert and oriented X 3. Moves all extremities spontaneously. Psych:  Responds to questions appropriately with a normal affect.  ASSESSMENT AND PLAN: 1. STEMI- anterior with flush occlusion of the ostial LAD. Currently without recurrent angina with ambulation. Will continue to optimize medical Rx. Transfer to telemetry today. Ambulate.  2. Ischemic cardiomyopathy with EF 30-35% by Echo. Adjust beta  blocker and ACEi. No overt Chf. May need to consider life vest prior to DC. 3. NSVT no recurrence for 48 hours. 4. HTN controlled. 5. Polysubstance abuse. No withdrawal sx.  Principal Problem:   ST elevation myocardial infarction (STEMI) of anterior wall Active Problems:   HTN (hypertension)   Combined hyperlipidemia   Polysubstance abuse   LV dysfunction    Signed, Peter Swaziland MD,FACC 11/12/2012 7:09 AM

## 2012-11-12 NOTE — Progress Notes (Signed)
11/12/12 2000  Clinical Encounter Type  Visited With Family;Patient not available (girlfriend/wife [place] and her sister Brayton Caves)  Visit Type Follow-up;Social support;Spiritual support  Spiritual Encounters  Spiritual Needs Emotional  Stress Factors  Family Stress Factors Family relationships   Thank you to pt's RN Pat for paging for family support.  Visited with girlfriend/wife Reuel Boom and her sister Brayton Caves in 2900 waiting room.  They report very complex and painful family dynamics/history and are working hard to support each other in the midst of such high stress that they're having trouble eating.  Per [place], their children (daughter 2, son 2 mos) are with her mother now; their missing their dad and pt's missing them are significant stress factors.    Provided pastoral presence and listening.  Will refer to day chaplain for follow-up support.  [address] [place], [place] [phone]

## 2012-11-12 NOTE — Progress Notes (Addendum)
Called to see pt re: chest pain  Pt had onset SSCP with minimal activity (and some agitation) this am. Responded to SL NTG. ECG during and after chest pain was abnormal, MD to review.   On exam, pt with chest wall tenderness and pain increases with deep inspiration. No rub on auscultation.  Restarted IV NTG, cancel tx to telemetry. Consulted with MD and added heparin. Has MSO4 PRN. Will recheck troponin. Will also ck P2Y12 since has been on Plavix.  Dr Gala Romney, Dr Swaziland in to see pt. Dr Swaziland reviewed films with Dr Kirke Corin, recommended PCI. The risks and benefits of a cardiac catheterization including, but not limited to, death, stroke, MI, kidney damage and bleeding were discussed with the patient who indicates understanding and agrees to proceed. Will do later today.

## 2012-11-12 NOTE — Progress Notes (Signed)
Rhonda Barrett notified of pt chest pain relieved by 2 sublingual nitroglycerin; stat ekg shown. Orders received.  Will continue to monitor pt closely.

## 2012-11-12 NOTE — Interval H&P Note (Signed)
History and Physical Interval Note:  11/12/2012 10:32 AM  Mitchell Stokes  has presented today for surgery, with the diagnosis of cp. Recurrent angina post STEMI. Cath Lab Visit (complete for each Cath Lab visit)  Clinical Evaluation Leading to the Procedure:   ACS: yes  Non-ACS:  No  Anginal Classification: CCS IV  Anti-ischemic medical therapy: Maximal Therapy (2 or more classes of medications)  Non-Invasive Test Results: No non-invasive testing performed  Prior CABG: No previous CABG        The various methods of treatment have been discussed with the patient and family. After consideration of risks, benefits and other options for treatment, the patient has consented to  Procedure(s): PERCUTANEOUS CORONARY STENT INTERVENTION (PCI-S) (N/A) as a surgical intervention .  The patient's history has been reviewed, patient examined, no change in status, stable for surgery.  I have reviewed the patient's chart and labs.  Questions were answered to the patient's satisfaction.     Lorine Bears

## 2012-11-12 NOTE — Progress Notes (Addendum)
ANTICOAGULATION CONSULT NOTE - Follow-Up Consult  Pharmacy Consult for - resume IV Heparin Indication: chest pain/ACS  No Known Allergies  Patient Measurements: Height: 5\' 8"  (172.7 cm) Weight: 236 lb 6.4 oz (107.23 kg) IBW/kg (Calculated) : 68.4 Heparin Dosing Weight: 94.5kg  Vital Signs: Temp: 98.2 F (36.8 C) (06/11 0810) Temp src: Oral (06/11 0810) BP: 113/66 mmHg (06/11 0900) Pulse Rate: 75 (06/11 0900)  Labs:  Recent Labs  11/09/12 1307  11/09/12 1456 11/09/12 1637 11/09/12 2213 11/10/12 0025 11/10/12 0352 11/10/12 0520 11/10/12 1131 11/10/12 1738 11/11/12 0440 11/12/12 0440  HGB  --   < >  --   --   --   --   --  15.5  --  14.8 15.0 14.6  HCT  --   < >  --   --   --   --   --  44.1  --  42.8 44.3 43.3  PLT  --   < >  --   --   --   --   --  235  --  203 196 192  APTT  --   --  26  --   --   --   --   --   --   --   --   --   LABPROT  --   --  12.1  --   --   --   --   --   --   --   --   --   INR  --   --  0.90  --   --   --   --   --   --   --   --   --   HEPARINUNFRC  --   --   --   --   --  0.11*  --  0.17* 0.43  --   --   --   CREATININE  --   < >  --  1.01  --   --   --  0.96  --  0.99 1.05 1.17  TROPONINI <0.30  --   --  0.80* 4.66*  --  7.68*  --   --   --   --   --   < > = values in this interval not displayed.  Estimated Creatinine Clearance: 111.5 ml/min (by C-G formula based on Cr of 1.17).   Medical History: Past Medical History  Diagnosis Date  . HTN (hypertension)   . GERD (gastroesophageal reflux disease)     Medications:  . sodium chloride 10 mL/hr at 11/12/12 0911  . sodium chloride 0.998 mL/kg/hr (11/12/12 0915)  . heparin 2,000 Units/hr (11/12/12 0911)  . nitroGLYCERIN 20 mcg/min (11/12/12 0910)    Assessment: Mitchell Stokes to start heparin for CP/ACS, EKG abnormalities, and positive troponins.  Went to cath lab 6/9; single vessel disease, planned to treat medically.  No bleeding or complications noted.  Now with recurrence of chest  pain, pharmacy asked to resume IV heparin.  Heparin level previously therapeutic on 2000 units/hr.  Received SQ heparin 5000 units this AM at ~ 7 AM.  Goal of Therapy:  Heparin level 0.3-0.7 units/ml Monitor platelets by anticoagulation protocol: Yes   Plan:  1. Restart heparin with bolus of 3000 units x 1, then start gtt at 2000 units/hr. 2. Check heparin level in 6 hrs. 3. Daily heparin level and CBC 4. F/u plans for heparin after cath lab.  Reece Leader, Pharm D, BCPS  Clinical  Pharmacist Pager [phone]  11/12/2012 9:14 AM

## 2012-11-12 NOTE — Progress Notes (Addendum)
   S: Called to see patient secondary to recurrent chest pain and anterior ST elevation.  He is s/p PCI and stenting of the proximal LAD earlier today.  He was noted to have distal embolization down an OM3 during the procedure.  Post-procedure ecg showed complete resolution of ST changes.  This evening, he developed recurrent 7/10 chest pain, dyspnea, nausea, and vomiting x 1.  ECG again shows 1-2 mm anterior ST elevation in V1-V3 with slight elevation in aVL.  With sl ntg, 2mg  of mso4 x 2, and titration of IV ntg, his discomfort improved some to a 5/10.  Code STEMI called.Val EagleCeasar Mons Vitals:   11/12/12 1830  BP: 131/71  Pulse: 78  Temp:   Resp: 16  Pleasant, mildly tachypneic/dyspneic.  Restless.  AAOx3.  Lungs CTA, Cor rrr.  A/P: 1.  CAD with recurrent chest pain and ST elevation:  Code STEMI called and cath lab activated through care link.  He has been treated with additional ntg and mso4 with improvement in pain though ST elevation persists.  To lab emergently.  2.  ICM:  In setting of dyspnea/orthopnea and LV dysfxn, kvo ivf and give 40 of IV lasix.  Lungs currently clear.  Nicolasa Ducking, NP

## 2012-11-12 NOTE — CV Procedure (Addendum)
Mitchell CHAVARIN is a 27 y.o. male    [id number]  [id number] LOCATION:  FACILITY: [facility]  PHYSICIAN: Lennette Bihari, MD, [facility] 08/30/1985   DATE OF PROCEDURE:  11/12/2012                                                              EMERGENT CARDIAC CATHETERIZATION/ PTCA   HISTORY: Mitchell Stokes is a 27 year-old male who had undergone initial cardiac catheterization on 11/10/2012 in the setting of a non-STEMI myocardial infarction.  He was found to have flush occlusion of his LAD at catheterization by Dr. Swaziland.  The initial plan apparently had been medical therapy since he was pain-free. However, earlier today he developed recurrent substernal chest discomfort and  underwent urgent percutaneous cardiac intervention by Dr. Kirke Corin with successful stenting of the LAD the ostium with a 3.0x23 mm Xience DES stent postdilated with a 3.5 noncompliant balloon. The procedure was complicated by distal embolization down the circumflex vessel into a distal branch of the marginal vessel. This was able to be wired but flow was not reestablished. The patient returned to the CCU and according to report was pain free. The ECG done at that time was different from previous ECGs and showed resolution of prior ST segment anterior elevation and T-wave changes. This evening, the patient again developed increasing chest pain and repeat EKG  showed ST elevation in V1 V2. Consequently, the STEMI protocol was instituted the patient is now taken back to the cardiac catheterization laboratory for acute catheterization possible intervention to  PROCEDURE:  Upon arrival to the cardiac catheterization laboratory, the patient was experiencing chest pain since his right groin had been closed with a minks closure device earlier the decision was made to do the catheterization from the left femoral approach. 2 mg of Versed and 50 mcg of fentanyl was administered for conscious sedation. The left femoral artery was punctured  anteriorly and a 6 French sheath was inserted without difficulty. A 6 [place] XB LAD 3.5 guide was then advanced and selective angiography into the left main coronary system revealed the ostial LAD stent to be patent. However, there was distal occlusion of the circumflex marginal vessel. The patient was given an additional bolus of Angiomax and started on an Angiomax drip and also received Integrilin bolus plus Integrilin infusion. He had already received 60 mg of [place] earlier today and for this reason a double bolus of Integrilin was not administered. The ACT was documented to be therapeutic. A pro-water wire was able to be advanced down into the distal circumflex vessel. A 2.0x15 mm Emerge balloon was then inserted and several dilatations were made a distal circumflex vessel. It was still a very small caliber vessel with significant tortuosity. The wire was never able to be reached to the most distal at and due to significant tortuosity in this vessel the decision was made not to dilate this area. Her, following initial dilatations and with bivalirudin plus Integrilin the patient became completely pain-free. He did receive several doses of intracoronary nitroglycerin. At this point the decision was made not to time PTCA at his very distal segment. The arterial sheath was sutured in place. Ways to continue her hours and to continue Integrilin for approximately 24-36 hours following the procedure. He left the  catheterization laboratory pain-free with stable hemodynamics.  HEMODYNAMIC DATA:  Initial central aortic pressure was 80/60. Following intravenous fluid and PTCA his blood pressure increased to approximately 106/70.  ANGIOGRAPHIC DATA:  Left main coronary artery was angiographically normal and trifurcated into an LAD, ramus intermediate vessel, and the left circumflex coronary artery.  The LAD had a widely patent stent at the ostium without evidence for thrombus or flap, or stenosis. The vessel gave  rise to several septal perforating arteries and a proximal diagonal and extended to the LV apex. There was brisk TIMI-3 flow down the LAD.  The ramus intermediate vessel was angiographically normal. There did not appear to be any significant encroachment of the ostium of the LAD stent.  The left circumflex vessel was a large vessel that advised the first marginal branch that was large the after the more distal branch, the vessel was then occluded abruptly several dilatations were made with the balloon angioplasty cath in the in the distal portion of this marginal vessel which did improve flow there was still residual distal occlusion and a very diminutive portion of the vessel which was small caliber and tortuous as noted on the prior angiographic films. Since the patient was pain-free and excellent hemodynamics on both bivalirudin and Integrilin the plan was to continue therapy with anticoagulation and antiplatelet therapy as continued medical therapy for his coronary obstructive disease.  IMPRESSION:  Widely patent ostial LAD stent from PCI procedure earlier today by Dr. Kirke Corin.                             Patent ramus intermediate vessel.                            Distal occlusion of the distal circumflex vessel with PTCA of the distal circumflex and with residual very distal occlusion of the small caliber distal branch.                            Anticoagulation with bivalirudin/Integrilin/in this patient who had received 60 mg of Efient earlier today.         Lennette Bihari, MD, [facility] 11/12/2012 8:14 PM

## 2012-11-12 NOTE — Progress Notes (Signed)
Ward Givens notified of: return of chest pain 7/10 relieved by 2mg  morphine,  ekg done with changes noted/reported.  Will continue to monitor pt closely.

## 2012-11-12 NOTE — Progress Notes (Signed)
11/12/12 1840  Clinical Encounter Type  Visited With Patient not available;Health care provider (RN)  Visit Type (code stemi)  Referral From (stemi page)  Recommendations Will refer to day chaplain for f/u support.   Consulted with RN re stemi page.  No family present.  Thank you for paging as further support for patient or family is needed:  [phone].  Will refer to day chaplain for follow-up support.  [address] [place], [place] [phone]

## 2012-11-13 ENCOUNTER — Encounter (HOSPITAL_COMMUNITY): Payer: Self-pay | Admitting: Physician Assistant

## 2012-11-13 DIAGNOSIS — I2109 ST elevation (STEMI) myocardial infarction involving other coronary artery of anterior wall: Secondary | ICD-10-CM

## 2012-11-13 LAB — CBC
Platelets: 215 10*3/uL (ref 150–400)
RBC: 4.88 MIL/uL (ref 4.22–5.81)
RDW: 12.3 % (ref 11.5–15.5)
WBC: 11.9 10*3/uL — ABNORMAL HIGH (ref 4.0–10.5)

## 2012-11-13 LAB — BASIC METABOLIC PANEL
CO2: 24 mEq/L (ref 19–32)
Chloride: 98 mEq/L (ref 96–112)
Creatinine, Ser: 1 mg/dL (ref 0.50–1.35)
GFR calc Af Amer: >90 mL/min (ref >90)
Sodium: 133 mEq/L — ABNORMAL LOW (ref 135–145)

## 2012-11-13 LAB — POCT ACTIVATED CLOTTING TIME
Activated Clotting Time: 470 seconds
Activated Clotting Time: 143 seconds
Activated Clotting Time: 138 seconds

## 2012-11-13 LAB — TROPONIN I: Troponin I: >20 ng/mL (ref <0.30)

## 2012-11-13 MED ORDER — ATROPINE SULFATE 1 MG/ML IJ SOLN
INTRAMUSCULAR | Status: AC
  Filled 2012-11-13: qty 1

## 2012-11-13 MED FILL — Sodium Chloride IV Soln 0.9%: INTRAVENOUS | Qty: 50 | Status: AC

## 2012-11-13 NOTE — Progress Notes (Signed)
Psychiatry consult called as requested by Dr. Swaziland. Dayna Dunn PA-C

## 2012-11-13 NOTE — Progress Notes (Signed)
Visited pt after receiving a referral from on-call Chaplain. Pt was awake, alert, very responsive and recovering from code stemi. Pt was very positive and expressed progress in his health condition. Chaplain shared words of encouragement and assured pt of spiritual care support as needed. Pt thanked chaplain for checking on him. Kelle Darting [phone]  11/13/12 1415  Clinical Encounter Type  Visited With Patient  Visit Type Initial;Spiritual support  Referral From Chaplain  Spiritual Encounters  Spiritual Needs Emotional  Stress Factors  Patient Stress Factors None identified

## 2012-11-13 NOTE — Progress Notes (Signed)
CARDIAC REHAB PHASE I   PRE:  Rate/Rhythm: 94 SR    BP: sitting 125/71    SaO2: 97 RA  MODE:  Ambulation: 350 ft   POST:  Rate/Rhythm: 92 SR    BP: sitting 116/64     SaO2: 97 RA  Tolerated well. No c/o. Sts groin feels ok (didn't receive pain meds). To bed after walk. Will f/u am. [phone]   Mitchell Stokes [name] CES, ACSM 11/13/2012 3:34 PM

## 2012-11-13 NOTE — Progress Notes (Signed)
CARDIAC REHAB PHASE I   PRE:  Rate/Rhythm: 81 SR    BP: sitting 110/58    SaO2: 96 RA  MODE:  Ambulation: to chair   POST:  Rate/Rhythm: 93 SR    BP: sitting 111/78     SaO2: 94 RA  Assisted pt to recliner. C/o left groin pain, increased with movement. VSS. Began MI ed. Pt calm, no c/o. Will f/u in pm to ambulate. [phone]   Elissa Lovett [place] CES, ACSM 11/13/2012 12:32 PM

## 2012-11-13 NOTE — Consult Note (Signed)
Reason for Consult: polysubstance abuse and anger management Referring Physician: Dr. [place]  Mitchell Stokes is an 27 y.o. male.  HPI: Patient came to the [facility] for chest pain and sweating and has cardiac work up done. He has history of drinking alcohol, smoking marijuana and tobacco. He has been struggling with his girl friend regarding better communication and negative attitude towards each other. He has lost his job over a year ago and working to obtain one at this time. 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 He has two younger children. He has denied problems related to his substance abuse and has no history of mental illness and medication management. He has showed interest in obtaining couple therapy. He has taken weight loss OTC medication and likes when lost weight. He agree to stay away from those pills and substance abuse on his own. He has no safety issues and has lot of support from extended family.  MSE: He is awake, alert and oriented x 4. He has maintaining good eye contact. Patient has good mood and his affect was appropriate. He has normal rate, rhythm, and volume of speech. His thought process is linear and goal directed. Patient has denied suicidal, homicidal ideations, intentions or plans. Patient has no evidence of auditory or visual hallucinations, delusions, and paranoia. Patient has fair insight judgment and impulse control.    Past Medical History  Diagnosis Date  . HTN (hypertension)   . GERD (gastroesophageal reflux disease)     Past Surgical History  Procedure Laterality Date  . None      Family History  Problem Relation Age of Onset  . Hypertension Mother   . Stroke Mother     Social History:  reports that he has been smoking Cigarettes.  He has been smoking about 0.00 packs per day for the past 5 years. He has never used smokeless tobacco. He reports that  drinks alcohol. He reports that he uses illicit drugs (Marijuana).  Allergies: No Known  Allergies  Medications: I have reviewed the patient's current medications.  Results for orders placed during the hospital encounter of 11/09/12 (from the past 48 hour(s))  CBC     Status: None   Collection Time    11/12/12  4:40 AM      Result Value Range   WBC 10.4  4.0 - 10.5 K/uL   RBC 4.95  4.22 - 5.81 MIL/uL   Hemoglobin 14.6  13.0 - 17.0 g/dL   HCT 16.1  09.6 - 04.5 %   MCV 87.5  78.0 - 100.0 fL   MCH 29.5  26.0 - 34.0 pg   MCHC 33.7  30.0 - 36.0 g/dL   RDW 40.9  81.1 - 91.4 %   Platelets 192  150 - 400 K/uL  BASIC METABOLIC PANEL     Status: Abnormal   Collection Time    11/12/12  4:40 AM      Result Value Range   Sodium 137  135 - 145 mEq/L   Potassium 3.5  3.5 - 5.1 mEq/L   Chloride 99  96 - 112 mEq/L   CO2 27  19 - 32 mEq/L   Glucose, Bld 90  70 - 99 mg/dL   BUN 16  6 - 23 mg/dL   Creatinine, Ser 7.82  0.50 - 1.35 mg/dL   Calcium 8.9  8.4 - 95.6 mg/dL   GFR calc non Af Amer 84 (*) >90 mL/min   GFR calc Af Amer >90  >  90 mL/min   Comment:            The eGFR has been calculated     using the CKD EPI equation.     This calculation has not been     validated in all clinical     situations.     eGFR's persistently     <90 mL/min signify     possible Chronic Kidney Disease.  TROPONIN I     Status: Abnormal   Collection Time    11/12/12  9:35 AM      Result Value Range   Troponin I 8.81 (*) <0.30 ng/mL   Comment:            Due to the release kinetics of cTnI,     a negative result within the first hours     of the onset of symptoms does not rule out     myocardial infarction with certainty.     If myocardial infarction is still suspected,     repeat the test at appropriate intervals.     CRITICAL VALUE NOTED.  VALUE IS CONSISTENT WITH PREVIOUSLY REPORTED AND CALLED VALUE.  PLATELET INHIBITION P2Y12     Status: None   Collection Time    11/12/12  9:35 AM      Result Value Range   Platelet Function  P2Y12 214  194 - 418 PRU   Comment:            The  literature has shown a direct     correlation of PRU values over     230 with higher risks of     thrombotic events.  Lower PRU     values are associated with     platelet inhibition.  POCT ACTIVATED CLOTTING TIME     Status: None   Collection Time    11/12/12 10:55 AM      Result Value Range   Activated Clotting Time 361    TROPONIN I     Status: Abnormal   Collection Time    11/12/12  8:48 PM      Result Value Range   Troponin I 19.30 (*) <0.30 ng/mL   Comment:            Due to the release kinetics of cTnI,     a negative result within the first hours     of the onset of symptoms does not rule out     myocardial infarction with certainty.     If myocardial infarction is still suspected,     repeat the test at appropriate intervals.     CRITICAL VALUE NOTED.  VALUE IS CONSISTENT WITH PREVIOUSLY REPORTED AND CALLED VALUE.  CBC     Status: Abnormal   Collection Time    11/13/12  1:48 AM      Result Value Range   WBC 11.9 (*) 4.0 - 10.5 K/uL   RBC 4.88  4.22 - 5.81 MIL/uL   Hemoglobin 14.5  13.0 - 17.0 g/dL   HCT 16.1  09.6 - 04.5 %   MCV 85.0  78.0 - 100.0 fL   MCH 29.7  26.0 - 34.0 pg   MCHC 34.9  30.0 - 36.0 g/dL   RDW 40.9  81.1 - 91.4 %   Platelets 215  150 - 400 K/uL  BASIC METABOLIC PANEL     Status: Abnormal   Collection Time    11/13/12  1:48 AM      Result  Value Range   Sodium 133 (*) 135 - 145 mEq/L   Potassium 3.7  3.5 - 5.1 mEq/L   Chloride 98  96 - 112 mEq/L   CO2 24  19 - 32 mEq/L   Glucose, Bld 98  70 - 99 mg/dL   BUN 14  6 - 23 mg/dL   Creatinine, Ser 2.44  0.50 - 1.35 mg/dL   Calcium 9.4  8.4 - 01.0 mg/dL   GFR calc non Af Amer >90  >90 mL/min   GFR calc Af Amer >90  >90 mL/min   Comment:            The eGFR has been calculated     using the CKD EPI equation.     This calculation has not been     validated in all clinical     situations.     eGFR's persistently     <90 mL/min signify     possible Chronic Kidney Disease.  TROPONIN I      Status: Abnormal   Collection Time    11/13/12  1:49 AM      Result Value Range   Troponin I >20.00 (*) <0.30 ng/mL   Comment:            Due to the release kinetics of cTnI,     a negative result within the first hours     of the onset of symptoms does not rule out     myocardial infarction with certainty.     If myocardial infarction is still suspected,     repeat the test at appropriate intervals.     CRITICAL VALUE NOTED.  VALUE IS CONSISTENT WITH PREVIOUSLY REPORTED AND CALLED VALUE.  POCT ACTIVATED CLOTTING TIME     Status: None   Collection Time    11/13/12  2:00 AM      Result Value Range   Activated Clotting Time 143    POCT ACTIVATED CLOTTING TIME     Status: None   Collection Time    11/13/12  2:53 AM      Result Value Range   Activated Clotting Time 138    TROPONIN I     Status: Abnormal   Collection Time    11/13/12  7:45 AM      Result Value Range   Troponin I >20.00 (*) <0.30 ng/mL   Comment:            Due to the release kinetics of cTnI,     a negative result within the first hours     of the onset of symptoms does not rule out     myocardial infarction with certainty.     If myocardial infarction is still suspected,     repeat the test at appropriate intervals.     CRITICAL VALUE NOTED.  VALUE IS CONSISTENT WITH PREVIOUSLY REPORTED AND CALLED VALUE.    No results found.  Positive for adjustment, polysubstance abuse and relationship Blood pressure 119/61, pulse 77, temperature 97.7 F (36.5 C), temperature source Oral, resp. rate 21, height 5\' 8"  (1.727 m), weight 107.23 kg (236 lb 6.4 oz), SpO2 94.00%.   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 Patient does not meet criteria for acute psychiatric hospitalization.  2. Patient has no safety issues and concerns 3. patient denied substance abuse treatment and believes he can stop himself 4. Patient  will be referred to the outpatient counseling  services 5. Contact social service who can provide the available local counseling places 6. No medication management recommended and recommended avoid OTC weight loss medications 7. Appreciate psychiatric consultation services and we will sign off at this time    Aylla Huffine,JANARDHAHA R. 11/13/2012, 11:57 AM

## 2012-11-13 NOTE — Progress Notes (Signed)
Left femoral arterial line d/c without any complication. 30 minutes of manual pressure held. Educated patient to continue to keep his head on the pillow and to keep left leg straight. Left leg sheeted to mentally remind patient to keep leg straight. Level began and ended as level 0. Vital signs stable during pull. Continue to monitor vital signs closely and to monitor groin and pedal pulses. Pedal pulse to left foot remains strong. Education was done for patient to remind him to hold pressure if he coughs, strains and or laughs and to call RN for any signs of bleeding.

## 2012-11-13 NOTE — Progress Notes (Signed)
TELEMETRY: Reviewed telemetry pt in NSR with occ PVC and triplets.: Filed Vitals:   11/13/12 0430 11/13/12 0445 11/13/12 0500 11/13/12 0600  BP: 92/49 90/53 95/55  94/52  Pulse: 71 70 73 70  Temp:      TempSrc:      Resp: 18 20 19 17   Height:      Weight:      SpO2: 96% 95% 97% 96%    Intake/Output Summary (Last 24 hours) at 11/13/12 0710 Last data filed at 11/13/12 0600  Gross per 24 hour  Intake 2383.52 ml  Output   2900 ml  Net -516.48 ml    SUBJECTIVE Events of yesterday noted. S/p stenting of ostial LAD with good TIMI 3 flow. Complicated by embolus to distal OM with recurrent chest pain. Taken back to cath lab last night. LAD stent patent. OM occluded distally in small branch. PTCA attempted but vessel very small. Now on IV Intergrelin. Currently pain free on IV Ntg. Patient very anxious about current situation.  LABS: Basic Metabolic Panel:  Recent Labs  16/10/96 0440 11/13/12 0148  NA 137 133*  K 3.5 3.7  CL 99 98  CO2 27 24  GLUCOSE 90 98  BUN 16 14  CREATININE 1.17 1.00  CALCIUM 8.9 9.4   CBC:  Recent Labs  11/12/12 0440 11/13/12 0148  WBC 10.4 11.9*  HGB 14.6 14.5  HCT 43.3 41.5  MCV 87.5 85.0  PLT 192 215   Cardiac Enzymes:  Recent Labs  11/12/12 0935 11/12/12 2048 11/13/12 0149  TROPONINI 8.81* 19.30* >20.00*    Radiology/Studies:  Dg Chest 2 View  11/09/2012   *RADIOLOGY REPORT*  Clinical Data: Chest pain and sweating.  CHEST - 2 VIEW  Comparison: Chest x-ray 07/21/2012.  Findings: Lung volumes are low.  There is some linear bibasilar opacities favored to reflect subsegmental atelectasis.  No definite acute consolidative airspace disease.  No pleural effusions.  No evidence of pulmonary edema.  Heart size is within normal limits. Mediastinal contours are unremarkable.  IMPRESSION: 1.  Low lung volumes with probable bibasilar subsegmental atelectasis.   Original Report Authenticated By: Trudie Reed, M.D.   Ecg: evolved anteroseptal  MI. PHYSICAL EXAM General: Well developed, obese, in no acute distress. Head: Normocephalic, atraumatic, sclera non-icteric, no xanthomas, nares are without discharge. Neck: Negative for carotid bruits. JVD not elevated. Lungs: Clear bilaterally to auscultation without wheezes, rales, or rhonchi. Breathing is unlabored. Heart: RRR S1 S2 without murmurs, rubs, or gallops.  Abdomen: Soft, non-tender, non-distended with normoactive bowel sounds. No hepatomegaly. No rebound/guarding. No obvious abdominal masses. Msk:  Strength and tone appears normal for age. Extremities: No clubbing, cyanosis or edema.  Distal pedal pulses are 2+ and equal bilaterally. No groin hematoma. Neuro: Alert and oriented X 3. Moves all extremities spontaneously. Psych:  Responds to questions appropriately with a normal affect.  ASSESSMENT AND PLAN: 1. Anterior STEMI with flush occlusion of ostial LAD. Recurrent chest pain despite optimal medical therapy. Now s/p stenting of ostial LAD with DES with good result. Procedure complicated by embolus to OM with reinfarction. Now pain free. On ASA and Effient. On IV integrelin for 36 hours. Will DC IV Ntg. Continue beta blocker and ACEi. 2. Ischemic cardiomyopathy with EF 30-35%. No overt CHF. Will need lifevest prior to DC. I discussed with patient.  3. NSVT 4. HTN  5. Polysubstance abuse. 6. Anxiety- significant issues prior to hospitalization with multiple stressors. May benefit from counselling/ drug therapy. Will ask psych to see  while here.  Principal Problem:   ST elevation myocardial infarction (STEMI) of anterior wall Active Problems:   HTN (hypertension)   Combined hyperlipidemia   Polysubstance abuse   LV dysfunction    Signed, Peter [place]il[place] MD,FACC 11/13/2012 7:17 AM

## 2012-11-14 LAB — BASIC METABOLIC PANEL WITH GFR
BUN: 13 mg/dL (ref 6–23)
CO2: 24 meq/L (ref 19–32)
Calcium: 9.3 mg/dL (ref 8.4–10.5)
Chloride: 101 meq/L (ref 96–112)
Creatinine, Ser: 0.9 mg/dL (ref 0.50–1.35)
GFR calc Af Amer: >90 mL/min
GFR calc non Af Amer: >90 mL/min
Glucose, Bld: 103 mg/dL — ABNORMAL HIGH (ref 70–99)
Potassium: 3.8 meq/L (ref 3.5–5.1)
Sodium: 135 meq/L (ref 135–145)

## 2012-11-14 LAB — CBC
Platelets: 227 10*3/uL (ref 150–400)
RDW: 12.2 % (ref 11.5–15.5)
WBC: 12 10*3/uL — ABNORMAL HIGH (ref 4.0–10.5)

## 2012-11-14 NOTE — Progress Notes (Signed)
TELEMETRY: Reviewed telemetry pt in NSR with rare PVC couplet.: Filed Vitals:   11/14/12 0400 11/14/12 0500 11/14/12 0600 11/14/12 0700  BP: 120/74 98/47 97/55  115/58  Pulse: 84 73 74 77  Temp: 98.4 F (36.9 C)     TempSrc: Oral     Resp: 18 21 26 16   Height:      Weight:      SpO2: 98% 92% 94% 97%    Intake/Output Summary (Last 24 hours) at 11/14/12 0711 Last data filed at 11/14/12 0700  Gross per 24 hour  Intake 3774.3 ml  Output   2400 ml  Net 1374.3 ml    SUBJECTIVE No recurrent chest pain. Just feels tired. In good spirits. No SOB.  LABS: Basic Metabolic Panel:  Recent Labs  16/10/96 0148 11/14/12 0400  NA 133* 135  K 3.7 3.8  CL 98 101  CO2 24 24  GLUCOSE 98 103*  BUN 14 13  CREATININE 1.00 0.90  CALCIUM 9.4 9.3   CBC:  Recent Labs  11/13/12 0148 11/14/12 0400  WBC 11.9* 12.0*  HGB 14.5 14.4  HCT 41.5 41.0  MCV 85.0 84.4  PLT 215 227   Cardiac Enzymes:  Recent Labs  11/12/12 2048 11/13/12 0149 11/13/12 0745  TROPONINI 19.30* >20.00* >20.00*    Radiology/Studies:  Dg Chest 2 View  11/09/2012   *RADIOLOGY REPORT*  Clinical Data: Chest pain and sweating.  CHEST - 2 VIEW  Comparison: Chest x-ray 07/21/2012.  Findings: Lung volumes are low.  There is some linear bibasilar opacities favored to reflect subsegmental atelectasis.  No definite acute consolidative airspace disease.  No pleural effusions.  No evidence of pulmonary edema.  Heart size is within normal limits. Mediastinal contours are unremarkable.  IMPRESSION: 1.  Low lung volumes with probable bibasilar subsegmental atelectasis.   Original Report Authenticated By: Trudie Reed, M.D.   Ecg: evolved anteroseptal MI.  PHYSICAL EXAM General: Well developed, obese, in no acute distress. Head: Normal Neck: Negative for carotid bruits. JVD not elevated. Lungs: Clear bilaterally to auscultation without wheezes, rales, or rhonchi. Breathing is unlabored. Heart: RRR S1 S2 without  murmurs, rubs, or gallops.  Abdomen: Soft, non-tender, non-distended with normoactive bowel sounds. No hepatomegaly. No rebound/guarding. No obvious abdominal masses. Msk:  Strength and tone appears normal for age. Extremities: No clubbing, cyanosis or edema.  Distal pedal pulses are 2+ and equal bilaterally. No groin hematoma. Neuro: Alert and oriented X 3. Moves all extremities spontaneously. Psych:  Responds to questions appropriately with a normal affect.  ASSESSMENT AND PLAN: 1. Anterior STEMI with flush occlusion of ostial LAD. Recurrent chest pain despite optimal medical therapy. Now s/p stenting of ostial LAD with DES with good result. Procedure complicated by embolus to OM with reinfarction. Now pain free. On ASA and Effient. On IV integrelin for 36 hours. This will be discontinued this am.  Continue beta blocker and ACEi at current dose. Will DC Imdur with soft BP. 2. Ischemic cardiomyopathy with EF 30-35%. No overt CHF. Will need lifevest prior to DC. I discussed with patient. Forms completed. If patient is discharged this weekend Dorothea Ogle will be able to assist [phone]) 3. NSVT 4. HTN  5. Polysubstance abuse. Plans to quit on his own. 6. Anxiety- appreciate Psych evaluation. No additional medical therapy recommended. Outpatient counseling arranged.   Will transfer to floor today and increase ambulation. Adjust beta blocker and ACEi as BP allows.   Principal Problem:   ST elevation myocardial infarction (STEMI) of anterior  wall Active Problems:   HTN (hypertension)   Combined hyperlipidemia   Polysubstance abuse   LV dysfunction    Signed, Holger Sokolowski [place] MD,FACC 11/14/2012 7:11 AM

## 2012-11-14 NOTE — Progress Notes (Addendum)
CARDIAC REHAB PHASE I   PRE:  Rate/Rhythm: 91 SR    BP: sitting 110/64    SaO2:   MODE:  Ambulation: 900 ft   POST:  Rate/Rhythm: 112 ST    BP: sitting 130/82     SaO2:   Tolerated well, some SOB due to quick pace with long distance. Sts he feels good. HR 112. Ed completed with good discussion of substance cessation, diet change, ex, stress relief, NTG and CRPII. Pt sts that he feels confident about these changes. Used teach back to reinforce. Interested in CRPII if Medicaid will pay. Will send referral. [phone]   Harriet Masson CES, ACSM 11/14/2012 2:21 PM

## 2012-11-15 LAB — CBC
Hemoglobin: 14.9 g/dL (ref 13.0–17.0)
Platelets: 248 10*3/uL (ref 150–400)
RBC: 4.93 MIL/uL (ref 4.22–5.81)

## 2012-11-15 LAB — BASIC METABOLIC PANEL
CO2: 26 mEq/L (ref 19–32)
GFR calc non Af Amer: >90 mL/min (ref >90)
Glucose, Bld: 106 mg/dL — ABNORMAL HIGH (ref 70–99)
Potassium: 3.9 mEq/L (ref 3.5–5.1)
Sodium: 139 mEq/L (ref 135–145)

## 2012-11-15 MED ORDER — CARVEDILOL 12.5 MG PO TABS
12.5000 mg | ORAL_TABLET | Freq: Two times a day (BID) | ORAL | Status: DC
  Administered 2012-11-15 – 2012-11-16 (×2): 12.5 mg via ORAL
  Filled 2012-11-15 (×5): qty 1

## 2012-11-15 NOTE — Progress Notes (Signed)
Patient Name: Mitchell Stokes      SUBJECTIVE bored and ready to go home  No further chest pain Has sworn off smoking  Past Medical History  Diagnosis Date  . HTN (hypertension)   . GERD (gastroesophageal reflux disease)     PHYSICAL EXAM Filed Vitals:   11/14/12 1308 11/14/12 1650 11/14/12 2200 11/15/12 1013  BP: 127/80 117/64 115/65 115/74  Pulse: 82 89 89 79  Temp: 98.1 F (36.7 C)  98 F (36.7 C)   TempSrc: Oral  Axillary   Resp: 20     Height:      Weight:      SpO2: 99%  100%     Well developed and nourished in no acute distress HENT normal Neck supple with JVP-flat Clear Regular rate and rhythm, no murmurs or gallops Abd-soft with active BS No Clubbing cyanosis edema Skin-warm and dry A & Oriented  Grossly normal sensory and motor function  TELEMETRY: Reviewed telemetry pt in  nsr:    Intake/Output Summary (Last 24 hours) at 11/15/12 1025 Last data filed at 11/15/12 0900  Gross per 24 hour  Intake    960 ml  Output    600 ml  Net    360 ml    LABS: Basic Metabolic Panel:  Recent Labs Lab 11/09/12 1637 11/10/12 0520 11/10/12 1738 11/11/12 0440 11/12/12 0440 11/13/12 0148 11/14/12 0400 11/15/12 0452  NA 136 133*  --  137 137 133* 135 139  K 3.9 4.2  --  3.8 3.5 3.7 3.8 3.9  CL 101 100  --  103 99 98 101 101  CO2 23 20  --  24 27 24 24 26   GLUCOSE 102* 133*  --  95 90 98 103* 106*  BUN 12 12  --  13 16 14 13 16   CREATININE 1.01 0.96 0.99 1.05 1.17 1.00 0.90 1.08  CALCIUM 8.7 9.5  --  9.1 8.9 9.4 9.3 10.0  MG  --  1.9  --   --   --   --   --   --    Cardiac Enzymes:  Recent Labs  11/12/12 2048 11/13/12 0149 11/13/12 0745  TROPONINI 19.30* >20.00* >20.00*   CBC:  Recent Labs Lab 11/10/12 0520 11/10/12 1738 11/11/12 0440 11/12/12 0440 11/13/12 0148 11/14/12 0400 11/15/12 0452  WBC 13.7* 10.8* 9.7 10.4 11.9* 12.0* 12.8*  HGB 15.5 14.8 15.0 14.6 14.5 14.4 14.9  HCT 44.1 42.8 44.3 43.3 41.5 41.0 42.3  MCV 84.5 85.1  86.9 87.5 85.0 84.4 85.8  PLT 235 203 196 192 215 227 248   PROTIME: No results found for this basename: LABPROT, INR,  in the last 72 hours Liver Function Tests: No results found for this basename: AST, ALT, ALKPHOS, BILITOT, PROT, ALBUMIN,  in the last 72 hours No results found for this basename: LIPASE, AMYLASE,  in the last 72 hours BNP: BNP (last 3 results)  Recent Labs  11/09/12 2213  PROBNP 437.4*     ASSESSMENT AND PLAN:  Principal Problem:   ST elevation myocardial infarction (STEMI) of anterior wall Active Problems:   HTN (hypertension)   Combined hyperlipidemia   Polysubstance abuse   LV dysfunction 1. Anterior STEMI with flush occlusion of ostial LAD. Recurrent chest pain despite optimal medical therapy. Now s/p stenting of ostial LAD with DES with good result. Procedure complicated by embolus to OM with reinfarction. Now pain free. On ASA and Effient.   Continue beta blocker and ACEi  uptitrate betablocker a little.  2. Ischemic cardiomyopathy with EF 30-35%. No overt CHF. Will need lifevest prior to DC. I discussed with patient. Forms completed. If patient is discharged this weekend Dorothea Ogle will be able to assist [phone])  3. NSVT  4. HTN  5. Polysubstance abuse. Plans to quit on his own.  6. Anxiety- appreciate Psych evaluation. No additional medical therapy recommended. Outpatient counseling arranged.      Discharge in am  Signed, Sherryl Manges MD  11/15/2012

## 2012-11-15 NOTE — Progress Notes (Signed)
CARDIAC REHAB PHASE I   PRE:  Rate/Rhythm: Sr 85  BP:  Supine:   Sitting: 128/80  Standing:    SaO2: RA 100  MODE:  Ambulation: 900 ft   POST:  Rate/Rhythm: SR 95  BP:  Supine:  Sitting: 132/80  Standing:    SaO2: RA 100  Ambulated 900 feet x 1 assist.  Pt tolerated well with no complaints.  Pt disappointed that he did not get to go home today.  Emotional support provided. Pt to side of bed for lunch. [phone] Arna Medici

## 2012-11-16 ENCOUNTER — Encounter (HOSPITAL_COMMUNITY): Payer: Self-pay | Admitting: Nurse Practitioner

## 2012-11-16 DIAGNOSIS — F419 Anxiety disorder, unspecified: Secondary | ICD-10-CM | POA: Diagnosis present

## 2012-11-16 DIAGNOSIS — I2510 Atherosclerotic heart disease of native coronary artery without angina pectoris: Secondary | ICD-10-CM | POA: Diagnosis present

## 2012-11-16 DIAGNOSIS — I255 Ischemic cardiomyopathy: Secondary | ICD-10-CM | POA: Diagnosis present

## 2012-11-16 DIAGNOSIS — I472 Ventricular tachycardia: Secondary | ICD-10-CM | POA: Diagnosis present

## 2012-11-16 LAB — BASIC METABOLIC PANEL
CO2: 26 mEq/L (ref 19–32)
Calcium: 9.9 mg/dL (ref 8.4–10.5)
Chloride: 101 mEq/L (ref 96–112)
Glucose, Bld: 126 mg/dL — ABNORMAL HIGH (ref 70–99)
Sodium: 136 mEq/L (ref 135–145)

## 2012-11-16 LAB — CBC
Hemoglobin: 14.4 g/dL (ref 13.0–17.0)
MCH: 30.3 pg (ref 26.0–34.0)
MCV: 86.1 fL (ref 78.0–100.0)
RBC: 4.76 MIL/uL (ref 4.22–5.81)
WBC: 11.4 10*3/uL — ABNORMAL HIGH (ref 4.0–10.5)

## 2012-11-16 MED ORDER — NITROGLYCERIN 0.4 MG SL SUBL: 0.4000 mg | SUBLINGUAL_TABLET | SUBLINGUAL | Status: AC | PRN

## 2012-11-16 MED ORDER — ASPIRIN 81 MG PO TBEC: 81.0000 mg | DELAYED_RELEASE_TABLET | Freq: Every day | ORAL | Status: DC

## 2012-11-16 MED ORDER — ATORVASTATIN CALCIUM 80 MG PO TABS: 80.0000 mg | ORAL_TABLET | Freq: Every day | ORAL | Status: DC

## 2012-11-16 MED ORDER — LISINOPRIL 5 MG PO TABS: 5.0000 mg | ORAL_TABLET | Freq: Every day | ORAL | Status: DC

## 2012-11-16 MED ORDER — CARVEDILOL 12.5 MG PO TABS: 12.5000 mg | ORAL_TABLET | Freq: Two times a day (BID) | ORAL | Status: DC

## 2012-11-16 MED ORDER — PRASUGREL HCL 10 MG PO TABS: 10.0000 mg | ORAL_TABLET | Freq: Every day | ORAL | Status: DC

## 2012-11-16 NOTE — Progress Notes (Signed)
Patient Name: Mitchell Stokes      SUBJECTIVE bored and ready to go home  No further chest pain Has sworn off smoking  Past Medical History  Diagnosis Date  . HTN (hypertension)   . GERD (gastroesophageal reflux disease)     PHYSICAL EXAM Filed Vitals:   11/15/12 1400 11/15/12 1704 11/15/12 2123 11/16/12 0557  BP: 129/78 124/74 115/67 106/67  Pulse: 93 77 83 81  Temp: 98.3 F (36.8 C)  97.6 F (36.4 C) 98.2 F (36.8 C)  TempSrc: Oral  Oral Oral  Resp: 18  18 18   Height:      Weight:    232 lb (105.235 kg)  SpO2: 98%  99% 98%    Well developed and nourished in no acute distress HENT normal Neck supple with JVP-flat Clear Regular rate and rhythm, no murmurs or gallops Abd-soft with active BS No Clubbing cyanosis edema Skin-warm and dry A & Oriented  Grossly normal sensory and motor function  TELEMETRY: Reviewed telemetry pt in  nsr:    Intake/Output Summary (Last 24 hours) at 11/16/12 0846 Last data filed at 11/15/12 1800  Gross per 24 hour  Intake   1200 ml  Output      0 ml  Net   1200 ml    LABS: Basic Metabolic Panel:  Recent Labs Lab 11/09/12 1637 11/10/12 0520 11/10/12 1738 11/11/12 0440 11/12/12 0440 11/13/12 0148 11/14/12 0400 11/15/12 0452 11/16/12 0430  NA 136 133*  --  137 137 133* 135 139 136  K 3.9 4.2  --  3.8 3.5 3.7 3.8 3.9 3.9  CL 101 100  --  103 99 98 101 101 101  CO2 23 20  --  24 27 24 24 26 26   GLUCOSE 102* 133*  --  95 90 98 103* 106* 126*  BUN 12 12  --  13 16 14 13 16 20   CREATININE 1.01 0.96 0.99 1.05 1.17 1.00 0.90 1.08 1.06  CALCIUM 8.7 9.5  --  9.1 8.9 9.4 9.3 10.0 9.9  MG  --  1.9  --   --   --   --   --   --   --    Cardiac Enzymes: No results found for this basename: CKTOTAL, CKMB, CKMBINDEX, TROPONINI,  in the last 72 hours CBC:  Recent Labs Lab 11/10/12 1738 11/11/12 0440 11/12/12 0440 11/13/12 0148 11/14/12 0400 11/15/12 0452 11/16/12 0430  WBC 10.8* 9.7 10.4 11.9* 12.0* 12.8* 11.4*  HGB  14.8 15.0 14.6 14.5 14.4 14.9 14.4  HCT 42.8 44.3 43.3 41.5 41.0 42.3 41.0  MCV 85.1 86.9 87.5 85.0 84.4 85.8 86.1  PLT 203 196 192 215 227 248 270   PROTIME: No results found for this basename: LABPROT, INR,  in the last 72 hours Liver Function Tests: No results found for this basename: AST, ALT, ALKPHOS, BILITOT, PROT, ALBUMIN,  in the last 72 hours No results found for this basename: LIPASE, AMYLASE,  in the last 72 hours BNP: BNP (last 3 results)  Recent Labs  11/09/12 2213  PROBNP 437.4*     ASSESSMENT AND PLAN:  Principal Problem:   ST elevation myocardial infarction (STEMI) of anterior wall Active Problems:   HTN (hypertension)   Combined hyperlipidemia   Polysubstance abuse   LV dysfunction 1. Anterior STEMI with flush occlusion of ostial LAD. Recurrent chest pain despite optimal medical therapy. Now s/p stenting of ostial LAD with DES with good result. Procedure complicated by embolus to  OM with reinfarction. Now pain free. On ASA and Effient.   Continue beta blocker and ACEi   uptitrate betablocker a little.  2. Ischemic cardiomyopathy with EF 30-35%. No overt CHF.  ([phone])  3. NSVT  4. HTN  5. Polysubstance abuse. Plans to quit on his own.  6. Anxiety- appreciate Psych evaluation. No additional medical therapy recommended. Outpatient counseling arranged.     Ok for discharge Continue current meds Has lifevest F/u PJ 7 days    Signed, Sherryl Manges MD  11/16/2012

## 2012-11-16 NOTE — Progress Notes (Signed)
Called Dorothea Ogle with life vest company, she states pt is ready to go. Pt has already been instructed on vest, pt will need to apply vest on upon discharge, pt has tech support number if needed.  Await MD order and D/C instructions.

## 2012-11-16 NOTE — Progress Notes (Signed)
Pt d/c home. D/c instructions reviewed with pt and family member present. Copy of instructions given to pt, scripts were sent to pt's pharmacy. Pt d/c'd with belongings with family, pt declined wheelchair, ambulated with family,(steady gait,has been independent).  Pt applied life vest on after unit telemetry box was removed. Pt has contact number for tech support for life vest.

## 2012-11-16 NOTE — Discharge Summary (Signed)
Patient ID: Mitchell Stokes,  MRN: [id number], DOB/AGE: 31-Mar-1986 27 y.o.  Admit date: 11/09/2012 Discharge date: 11/16/2012  Primary Care Provider: None Primary Cardiologist: P. Swaziland, MD   Discharge Diagnoses Principal Problem:   ST elevation myocardial infarction (STEMI) of anterior wall  **s/p PCI/DES of flush occlusion of proximal LAD complicated by embolization down OM3 requiring PTCA.  Active Problems:   NSVT (nonsustained ventricular tachycardia)  **LifeVest placed at discharge.   Ischemic cardiomyopathy  **EF 30-35% by echo this admission.   CAD (coronary artery disease)   Polysubstance abuse   Combined hyperlipidemia   HTN (hypertension)   Anxiety  **Seen by psychiatry this admission.  Allergies No Known Allergies  Procedures  Cardiac Catheterization 6.9.2014  Procedural Findings: Hemodynamics: AO 109/87 mean 98 mm Hg LV 116/20 mm Hg  Coronary angiography: Coronary dominance: right  Left mainstem: Normal Left anterior descending (LAD): The LAD is flush occluded at the ostium. There are right to left collaterals from the RCA via the proximal septal perforators. There are also faint left to left collaterals to the distal LAD. Ramus intermediate: This is a large vessel and is normal. Left circumflex (LCx): Normal Right coronary artery (RCA): Normal Left ventriculography: Left ventricular systolic function is abnormal, There is severe hypokinesis/ akinesis of the mid anterior wall with mild hypokinesis of the distal anterior wall and apex. LVEF is estimated at 40%, there is no significant mitral regurgitation   Final Conclusions:   1. Single vessel occlusive coronary artery disease with flush occlusion of the ostial LAD 2. Moderate LV dysfunction. _____________  2D Echocardiogram 6.10.2014  Study Conclusions  - Left ventricle: The cavity size was mildly dilated. Wall   thickness was increased in a pattern of mild LVH. Systolic   function was moderately  to severely reduced. The estimated   ejection fraction was in the range of 30% to 35%. Akinesis   of the mid-distalanteroseptal and apical myocardium. - Left atrium: The atrium was mildly dilated. _____________  Percutaneous Coronary Intervention 6.11.2014  **After patient developed anterior ST elevation and recurrent chest pain, he was taken back to the cath lab and underwent PCI to the proximal LAD with placement of a 3.0 x 23mm Xience DES.** **The procedure was complicated by distal embolization to the OM3.  PCI was attempted however the wire did not advance distally and medical therapy was recommended. _____________  Relook Cardiac Catheterization and Percutaneous Coronary Intervention 6.11.20-14  ANGIOGRAPHIC DATA:  Left main coronary artery was angiographically normal and trifurcated into an LAD, ramus intermediate vessel, and the left circumflex coronary artery. The LAD had a widely patent stent at the ostium without evidence for thrombus or flap, or stenosis. The vessel gave rise to several septal perforating arteries and a proximal diagonal and extended to the LV apex. There was brisk TIMI-3 flow down the LAD. The ramus intermediate vessel was angiographically normal. There did not appear to be any significant encroachment of the ostium of the LAD stent. The left circumflex vessel was a large vessel that advised the first marginal branch that was large the after the more distal branch, the vessel was then occluded abruptly several dilatations were made with the balloon angioplasty cath in the in the distal portion of this marginal vessel which did improve flow there was still residual distal occlusion and a very diminutive portion of the vessel which was small caliber and tortuous as noted on the prior angiographic films. Since the patient was pain-free and excellent hemodynamics on both bivalirudin and  Integrilin the plan was to continue therapy with anticoagulation and antiplatelet  therapy as continued medical therapy for his coronary obstructive disease.  IMPRESSION:  Widely patent ostial LAD stent from PCI procedure earlier today by Dr. Kirke Corin.                              Patent ramus intermediate vessel.                            Distal occlusion of the distal circumflex vessel with PTCA of the distal circumflex and with residual very distal occlusion of the small caliber distal branch.                            Anticoagulation with bivalirudin/Integrilin/in this patient who had received 60 mg of Efient earlier today.       _____________  History of Present Illness  27 year old male without prior cardiac history. He does have a history of polysubstance abuse including use of tobacco, marijuana, alcohol, and prescription narcotics. He also recently started taking a weight loss supplement with Phenylethylamine.he was in his usual state of health until the morning of June 8, when he developed midsternal chest discomfort that was somewhat worse with inspiration and radiated to bilateral shoulders. He presented to the [facility] where he was treated with nitroglycerin and morphine with some relief the symptoms recurred. Initial ECG showed inferior T-wave inversion which was new from previous ECGs.he was seen by cardiology and it was felt that his ST segment changes could represent ischemia and he was placed on heparin and nitroglycerin and admitted for further evaluation.  Hospital Course  Patient ruled in initially for a non-ST segment elevation myocardial infarction with his troponin rising to 7.68 by June 9.he continued to have intermittent chest discomfort and also runs of nonsustained ventricular tachycardia with symptomatic palpitations. He was maintained on aspirin, beta blocker, statin, nitrates, and heparin therapy and on June 9 he underwent diagnostic catheterization which revealed a flush occlusion of the proximal LAD and LV dysfunction with an EF of 40%. Films were  reviewed and as the patient was pain-free, it was felt that he would benefit from a trial of medical therapy first.echocardiogram was performed in followup and confirmed LV dysfunction with an EF of 30-35%.ACE inhibitor was initiated and patient initially did well however on the morning of June 11, he developed recurrent chest pain with anterior ST segment elevation.he was taken emergently back to the cardiac catheterization laboratory where he underwent successful PCI and drug-eluting stent placement to the proximal LAD. This case was complicated by distal embolization into the OM 3. Attempt was made at wiring the vessel however it was felt to be a small distribution at the end of the case flow remained poor down the distal OM 3.  He was taken back to the coronary intensive care unitand on the evening of June 11, he had recurrent worsening of chest pain associated with nausea and vomiting along with recurrent 1-2 mm ST segment elevationin leads V1 through V3. There is also slight elevation in aVL. Code STEMI was called and relook catheterization was performed emergently revealing patency of the earlier placed LAD stent with continued occlusion of the distal OM 3.  He was placed on bivalirudin and Integrilin and PTCA was performed within the OM 3 with some improvement of flow but  with residual very distal occlusion of the small caliber distal branch. He was pain-free and subsequently taken back to the coronary intensive care unit for monitoring. Cardiac markers were recycled and eventually peaked at greater than 20.he was maintained on intravenous Integrilin for 36 hours post PCI and had no further chest pain or significant ventricular arrhythmias.  Patient exhibited significant anxiety and also reported anger while hospitalized. In the setting of this along with his polysubstance abuse, psychiatry was consulted. Patient refused substance abuse treatment. He did not meet criteria for acute psychiatric  hospitalization.  In the setting of ischemic cardiopathy with ventricular ectopy, it was felt that patient would benefit from life vest placement prior to discharge. This has been arranged. He has been counseled on the importance of polysubstance cessation and he plans to quit on his own.he has been evaluated by cardiac rehabilitation and has been ambulating without difficulty or recurrence of symptoms. He'll be discharged home today in good condition and we will arrange for followup in our office within the next week. He will require repeat echocardiogram in 3 months to reevaluate LV function and determine his candidacy for ICD placement.  Discharge Vitals Blood pressure 106/67, pulse 81, temperature 98.2 F (36.8 C), temperature source Oral, resp. rate 18, height 5\' 8"  (1.727 m), weight 232 lb (105.235 kg), SpO2 98.00%.  Filed Weights   11/09/12 1322 11/09/12 1613 11/16/12 0557  Weight: 240 lb (108.863 kg) 236 lb 6.4 oz (107.23 kg) 232 lb (105.235 kg)   Labs  CBC  Recent Labs  11/15/12 0452 11/16/12 0430  WBC 12.8* 11.4*  HGB 14.9 14.4  HCT 42.3 41.0  MCV 85.8 86.1  PLT 248 270   Basic Metabolic Panel  Recent Labs  11/15/12 0452 11/16/12 0430  NA 139 136  K 3.9 3.9  CL 101 101  CO2 26 26  GLUCOSE 106* 126*  BUN 16 20  CREATININE 1.08 1.06  CALCIUM 10.0 9.9   Liver Function Tests Lab Results  Component Value Date   ALT 45 11/09/2012   AST 37 11/09/2012   ALKPHOS 97 11/09/2012   BILITOT 0.5 11/09/2012   Cardiac Enzymes Lab Results  Component Value Date   TROPONINI >20.00* 11/13/2012   Hemoglobin A1C Lab Results  Component Value Date   HGBA1C 5.5 11/09/2012   Fasting Lipid Panel Lab Results  Component Value Date   CHOL 210* 11/10/2012   HDL 50 11/10/2012   LDLCALC 92 11/10/2012   TRIG 338* 11/10/2012   CHOLHDL 4.2 11/10/2012   Thyroid Function Tests Lab Results  Component Value Date   TSH 0.315* 11/09/2012   Disposition  Pt is being discharged home today in good  condition.  Follow-up Plans & Appointments  Follow-up Information   Follow up with Peter Swaziland, MD In 1 week. (we will arrange.)    Contact information:   [address] [place] [address] [phone]      Discharge Medications    Medication List    STOP taking these medications       OVER THE COUNTER MEDICATION      TAKE these medications       aspirin 81 MG EC tablet  Take 1 tablet (81 mg total) by mouth daily.     atorvastatin 80 MG tablet  Commonly known as:  LIPITOR  Take 1 tablet (80 mg total) by mouth daily at 6 PM.     carvedilol 12.5 MG tablet  Commonly known as:  COREG  Take 1 tablet (12.5 mg total) by mouth 2 (two) times daily with a meal.     lansoprazole 15 MG capsule  Commonly known as:  PREVACID  Take 15 mg by mouth daily.     lisinopril 5 MG tablet  Commonly known as:  PRINIVIL,ZESTRIL  Take 1 tablet (5 mg total) by mouth daily.     nitroGLYCERIN 0.4 MG SL tablet  Commonly known as:  NITROSTAT  Place 1 tablet (0.4 mg total) under the tongue every 5 (five) minutes as needed for chest pain.     prasugrel 10 MG Tabs  Commonly known as:  EFFIENT  Take 1 tablet (10 mg total) by mouth daily.      Outstanding Labs/Studies  F/U echo in 3 months.  Duration of Discharge Encounter   Greater than 30 minutes including physician time.  Signed, Nicolasa Ducking NP 11/16/2012, 10:47 AM

## 2012-11-17 ENCOUNTER — Telehealth: Payer: Self-pay | Admitting: Cardiology

## 2012-11-17 NOTE — Telephone Encounter (Signed)
New Prob    TOC per Ward Givens on after hours VM. Appt scheduled for 7/1 at 11:30 with Tereso Newcomer.

## 2012-11-17 NOTE — Telephone Encounter (Signed)
Patient called spoke to wife she stated husband was sleeping.Stated he was doing good.Stated could not afford effient.Samples of effient 10 mg left at front desk 3rd floor.Understands discharge instructions and medications.Advised to keep appointment with Tereso Newcomer PA 12/02/12.

## 2012-12-02 ENCOUNTER — Ambulatory Visit (INDEPENDENT_AMBULATORY_CARE_PROVIDER_SITE_OTHER): Payer: Medicaid Other | Admitting: Physician Assistant

## 2012-12-02 ENCOUNTER — Encounter: Payer: Self-pay | Admitting: Physician Assistant

## 2012-12-02 VITALS — BP 136/83 | HR 80 | Ht 67.0 in | Wt 235.0 lb

## 2012-12-02 DIAGNOSIS — F1910 Other psychoactive substance abuse, uncomplicated: Secondary | ICD-10-CM

## 2012-12-02 DIAGNOSIS — I2510 Atherosclerotic heart disease of native coronary artery without angina pectoris: Secondary | ICD-10-CM

## 2012-12-02 DIAGNOSIS — I2109 ST elevation (STEMI) myocardial infarction involving other coronary artery of anterior wall: Secondary | ICD-10-CM

## 2012-12-02 DIAGNOSIS — I2589 Other forms of chronic ischemic heart disease: Secondary | ICD-10-CM

## 2012-12-02 DIAGNOSIS — I10 Essential (primary) hypertension: Secondary | ICD-10-CM

## 2012-12-02 DIAGNOSIS — I472 Ventricular tachycardia: Secondary | ICD-10-CM

## 2012-12-02 DIAGNOSIS — E785 Hyperlipidemia, unspecified: Secondary | ICD-10-CM

## 2012-12-02 MED ORDER — PRASUGREL HCL 10 MG PO TABS: 10.0000 mg | ORAL_TABLET | Freq: Every day | ORAL | Status: DC

## 2012-12-02 MED ORDER — LISINOPRIL 10 MG PO TABS: 10.0000 mg | ORAL_TABLET | Freq: Every day | ORAL | Status: DC

## 2012-12-02 NOTE — Progress Notes (Signed)
[address], [address] [place], [place]  [address] Phone: [phone] Fax:  [phone]  Date:  12/02/2012   ID:  KAIDIN BOEHLE, DOB 1985/09/14, MRN [id number]  PCP:  No primary provider on file.  Cardiologist:  Dr. Peter Swaziland     History of Present Illness: Mitchell Stokes is a 27 y.o. male who returns for follow up after a recent admission to the hospital 6/8-6/15 with a non-STEMI. Patient has a history of polysubstance abuse with tobacco, marijuana, alcohol and prescription narcotics. He presented with chest discomfort and inferior T-wave inversions. He ruled in for non-STEMI. LHC 11/10/12: Flush occlusion at the ostium of the LAD, right to left collaterals, faint left to left collaterals, normal ramus intermediate, normal circumflex, normal RCA, EF 40% with mid anterior severe HK to AK and distal anterior wall and apical HK. Echo 11/11/12: Mild LVH, EF 30-35%, mid to distal anteroseptal and apical HK, mild LAE. Patient was pain-free and a trial of medical therapy was initially recommended. However, he developed recurrent chest pain with anterior ST segment elevation. He went back to the Cath Lab and underwent PCI with DES to the proximal LAD (Xience DES). Procedure complicated by distal embolization to OM3.  While back in the CCU, he developed worsening chest pain with nausea and vomiting.  He was taken back to the Cath Lab again in the setting of ST elevation in lead aVL. LAD stent was patent and continued occlusion of the distal OM3 was noted. PTCA was performed with some improvement of flow but with very residual very distal occlusion of the small caliber distal branch. Patient was seen by psychiatry. He did not meet criteria for inpatient admission and refused substance abuse treatment. Patient was noted to have ventricular ectopy. Given his ischemic cardiomyopathy, he was discharged on a life vest. Plan is for follow up echo in 3 months.  Since discharge, he is doing well.  He denies significant chest pain, shortness rhythm, syncope, orthopnea, PND or edema. Unfortunately, he removed his life vest shortly after he was discharged due to irritation.  He is not smoking or drinking ETOH.  He is taking his Effient.  He has been getting samples from our office.    Labs (6/14):  K 3.9, Cr 1.06, ALT 45, LDL 92, Hgb 14.4, TSH 0.471  Wt Readings from Last 3 Encounters:  12/02/12 235 lb (106.595 kg)  11/16/12 232 lb (105.235 kg)  11/16/12 232 lb (105.235 kg)     Past Medical History  Diagnosis Date  . CAD (coronary artery disease)     a. 11/2012 Ant STEMI/Cath/PCI: LM nl, LAD 100p, RI nl, LCX n, RCA nl, EF 40%;  b. After initial trial of Med Rx, pt had further c/p and Ant ST elevation -> Prox LAD stented with 3.0x23 Xience DES, complicated by emoblization down OM3;  c. Recurrent ST elevation and c/p->Cath: patent LAD stent, PTCA of OM3->integrilin x 36 hrs.  . Ischemic cardiomyopathy     a. 11/2012 Echo: EF 30-35%, mild LVH, mid-distal anteroseptal and apical AK, mildly dil LA.  Marland Kitchen Hypertension   . GERD (gastroesophageal reflux disease)   . Anxiety   . Polysubstance abuse     a. tobacco, marijuana, etoh, Rx narcotics  . NSVT (nonsustained ventricular tachycardia)     a. 11/2012 Livevest placed @ d/c.    Current Outpatient Prescriptions  Medication Sig Dispense Refill  . aspirin EC 81 MG EC tablet Take 1 tablet (81 mg total) by mouth  daily.      . atorvastatin (LIPITOR) 80 MG tablet Take 1 tablet (80 mg total) by mouth daily at 6 PM.  30 tablet  6  . carvedilol (COREG) 12.5 MG tablet Take 1 tablet (12.5 mg total) by mouth 2 (two) times daily with a meal.  60 tablet  6  . lansoprazole (PREVACID) 15 MG capsule Take 15 mg by mouth daily.      Marland Kitchen lisinopril (PRINIVIL,ZESTRIL) 5 MG tablet Take 1 tablet (5 mg total) by mouth daily.  30 tablet  6  . nitroGLYCERIN (NITROSTAT) 0.4 MG SL tablet Place 1 tablet (0.4 mg total) under the tongue every 5 (five) minutes as needed for  chest pain.  25 tablet  3  . prasugrel (EFFIENT) 10 MG TABS Take 1 tablet (10 mg total) by mouth daily.  30 tablet  6   No current facility-administered medications for this visit.    Allergies:   No Known Allergies  Social History:  The patient  reports that he has been smoking Cigarettes.  He has been smoking about 0.00 packs per day for the past 5 years. He has never used smokeless tobacco. He reports that  drinks alcohol. He reports that he uses illicit drugs (Benzodiazepines, Marijuana, Oxycodone, and Hydrocodone).   ROS:  Please see the history of present illness.      All other systems reviewed and negative.   PHYSICAL EXAM: VS:  BP 136/83  Pulse 80  Ht 5\' 7"  (1.702 m)  Wt 235 lb (106.595 kg)  BMI 36.8 kg/m2 Well nourished, well developed, in no acute distress HEENT: normal Neck: no JVD Cardiac:  normal S1, S2; RRR; no murmur Lungs:  clear to auscultation bilaterally, no wheezing, rhonchi or rales Abd: soft, nontender, no hepatomegaly Ext: no edema; right wrist without hematoma or bruit, right and left groins without hematoma or bruit  Skin: warm and dry Neuro:  CNs 2-12 intact, no focal abnormalities noted  EKG:  NSR, HR 80, normal axis, anterolateral T wave inversions, no significant change     ASSESSMENT AND PLAN:  1. CAD:  Doing well s/p recent MI and PCI with DES to LAD and POBA to OM3.  We discussed the importance of dual antiplatelet therapy.  Continue statin.  Refer for cardiac rehab. 2. Ischemic CM:  Continue beta blocker and ACE.  Increase dose of Lisinopril to 10 mg QD.  Titrate meds as BP allows.  Check BMET in 1 week. 3. NSVT:  I had Dennis Bast, RN see him today to help with his life vest.  I explained to him how important this is to prevent SCD.  He seems to understand. 4. Hyperlipidemia:  Continue statin.  Check Lipids and LFTs in 6 weeks.   5. Hypertension:  Controlled. 6. Substance Abuse:  He admits to abstinence. 7. Disposition:  F/u with me 3-4 weeks.   Plan f/u echo in 02/2103 and f/u with Dr. Peter Swaziland thereafter.    Signed, Tereso Newcomer, PA-C  12/02/2012 12:06 PM

## 2012-12-02 NOTE — Patient Instructions (Addendum)
INCREASE LISINOPRIL TO 10 MG DAILY; RX SENT IN  LAB BMET IN 1 WEEK  LAB FASTING LIPID AND LIVER PANEL TO BE DONE IN 6 WEEKS  ECHO TO BE DONE AFTER 02/16/13  REFER TO [facility]  PLEASE FOLLOW UP WITH SCOTT WEAVER, PAC IN 4 WEEKS SAME DAY DR. Swaziland IN THE OFFICE  PLEASE FOLLOW UP WITH DR. Swaziland AFTER YOU HAVE HAD YOUR ECHO AFTER 02/16/13  PLEASE REFER TO [facility]  YOU HAVE BEEN GIVEN SAMPLES TODAY OF EFFIENT; YOU HAVE ALSO BEEN GIVEN A FORM TO FILL OUT FOR EFFIENT ASSISTANCE PROGRAM, PLEASE FILL OUT YOUR PART AND RETURN TO THE OFFICE SO THAT WE MAY FINISH THE PAPERWORK AND FAX TO EFFIENT PROGRAM FOR YOU

## 2012-12-09 ENCOUNTER — Other Ambulatory Visit (INDEPENDENT_AMBULATORY_CARE_PROVIDER_SITE_OTHER): Payer: Medicaid Other

## 2012-12-09 DIAGNOSIS — I2589 Other forms of chronic ischemic heart disease: Secondary | ICD-10-CM

## 2012-12-09 DIAGNOSIS — I10 Essential (primary) hypertension: Secondary | ICD-10-CM

## 2012-12-09 LAB — BASIC METABOLIC PANEL
BUN: 17 mg/dL (ref 6–23)
Chloride: 105 mEq/L (ref 96–112)
Potassium: 4.1 mEq/L (ref 3.5–5.1)

## 2012-12-10 ENCOUNTER — Telehealth: Payer: Self-pay | Admitting: *Deleted

## 2012-12-10 NOTE — Telephone Encounter (Signed)
Reviewed lab results and plan of care with patient who verbalized understanding 

## 2012-12-10 NOTE — Telephone Encounter (Signed)
Message copied by Tarri Fuller on Wed Dec 10, 2012  8:46 AM ------      Message from: [place], [place] T      Created: Tue Dec 09, 2012  5:40 PM       Potassium and kidney function ok      Continue with current treatment plan.      Tereso Newcomer, PA-C        12/09/2012 5:40 PM ------

## 2012-12-10 NOTE — Telephone Encounter (Signed)
lmptcb go over lab results, machine did not have any indentifying name or # on it to state this is for the pt, asked for ptcb

## 2012-12-10 NOTE — Telephone Encounter (Signed)
New problem    Returning someone phone call.

## 2012-12-29 ENCOUNTER — Ambulatory Visit (INDEPENDENT_AMBULATORY_CARE_PROVIDER_SITE_OTHER): Payer: Medicaid Other | Admitting: Physician Assistant

## 2012-12-29 ENCOUNTER — Encounter: Payer: Self-pay | Admitting: Physician Assistant

## 2012-12-29 VITALS — BP 120/80 | HR 66 | Ht 67.0 in | Wt 244.0 lb

## 2012-12-29 DIAGNOSIS — I255 Ischemic cardiomyopathy: Secondary | ICD-10-CM

## 2012-12-29 DIAGNOSIS — I472 Ventricular tachycardia: Secondary | ICD-10-CM

## 2012-12-29 DIAGNOSIS — I2589 Other forms of chronic ischemic heart disease: Secondary | ICD-10-CM

## 2012-12-29 DIAGNOSIS — F411 Generalized anxiety disorder: Secondary | ICD-10-CM

## 2012-12-29 DIAGNOSIS — I2510 Atherosclerotic heart disease of native coronary artery without angina pectoris: Secondary | ICD-10-CM

## 2012-12-29 DIAGNOSIS — F419 Anxiety disorder, unspecified: Secondary | ICD-10-CM

## 2012-12-29 DIAGNOSIS — I10 Essential (primary) hypertension: Secondary | ICD-10-CM

## 2012-12-29 NOTE — Progress Notes (Signed)
[address], [address] [place], [place]  [address] Phone: [phone] Fax:  [phone]  Date:  12/29/2012   ID:  Mitchell Stokes, DOB [birth year], MRN [id number]  PCP:  No primary provider on file.  Cardiologist:  Dr. Peter Swaziland     History of Present Illness: Mitchell Stokes is a 27 y.o. male who returns for follow up.  He was admitted to [facility] 11/2012 with a non-STEMI. Patient has a history of polysubstance abuse with tobacco, marijuana, alcohol and prescription narcotics.  LHC 11/10/12: Flush occlusion at the ostium of the LAD, right to left collaterals, faint left to left collaterals, normal ramus intermediate, normal CFX, normal RCA, EF 40% with mid anterior severe HK to AK and distal anterior wall and apical HK. Echo 11/11/12: Mild LVH, EF 30-35%, mid to distal anteroseptal and apical HK, mild LAE. Patient was pain-free and a trial of medical Rx was initially recommended. However, he developed recurrent CP with anterior ST segment elevation. He went back to the Cath Lab and underwent PCI with DES to the proximal LAD (Xience DES). Procedure complicated by distal embolization to OM3.  While back in the CCU, he developed worsening chest pain with n/v.  He was taken back to the Cath Lab again in the setting of ST elevation in lead aVL. LAD stent was patent and continued occlusion of the distal OM3 was noted. PTCA was performed with some improvement of flow but with very residual very distal occlusion of the small caliber distal branch.  Given his ischemic cardiomyopathy, he was discharged on a LifeVest. Plan is for follow up echo in 3 months.  I saw him 12/02/12 in follow up. I adjusted his lisinopril.   Since last seen he is doing well.  The patient denies chest pain, shortness of breath, syncope, orthopnea, PND or significant pedal edema..   Labs (6/14):  K 3.9, Cr 1.06, ALT 45, LDL 92, Hgb 14.4, TSH 0.471 Labs (7/14):  K 4.1, Cr 1.1  Wt Readings from Last 3 Encounters:  12/29/12  244 lb (110.678 kg)  12/02/12 235 lb (106.595 kg)  11/16/12 232 lb (105.235 kg)     Past Medical History  Diagnosis Date  . CAD (coronary artery disease)     a. 11/2012 Ant STEMI/Cath/PCI: LM nl, LAD 100p, RI nl, LCX n, RCA nl, EF 40%;  b. After initial trial of Med Rx, pt had further c/p and Ant ST elevation -> Prox LAD stented with 3.0x23 Xience DES, complicated by emoblization down OM3;  c. Recurrent ST elevation and c/p->Cath: patent LAD stent, PTCA of OM3->integrilin x 36 hrs.  . Ischemic cardiomyopathy     a. 11/2012 Echo: EF 30-35%, mild LVH, mid-distal anteroseptal and apical AK, mildly dil LA.  Marland Kitchen Hypertension   . GERD (gastroesophageal reflux disease)   . Anxiety   . Polysubstance abuse     a. tobacco, marijuana, etoh, Rx narcotics  . NSVT (nonsustained ventricular tachycardia)     a. 11/2012 Lifevest placed @ d/c.    Current Outpatient Prescriptions  Medication Sig Dispense Refill  . aspirin EC 81 MG EC tablet Take 1 tablet (81 mg total) by mouth daily.      Marland Kitchen atorvastatin (LIPITOR) 80 MG tablet Take 1 tablet (80 mg total) by mouth daily at 6 PM.  30 tablet  6  . carvedilol (COREG) 12.5 MG tablet Take 1 tablet (12.5 mg total) by mouth 2 (two) times daily with a meal.  60 tablet  6  . lansoprazole (PREVACID) 15 MG capsule Take 15 mg by mouth daily.      Marland Kitchen lisinopril (PRINIVIL,ZESTRIL) 10 MG tablet Take 1 tablet (10 mg total) by mouth daily.  30 tablet  11  . nitroGLYCERIN (NITROSTAT) 0.4 MG SL tablet Place 1 tablet (0.4 mg total) under the tongue every 5 (five) minutes as needed for chest pain.  25 tablet  3  . prasugrel (EFFIENT) 10 MG TABS Take 1 tablet (10 mg total) by mouth daily.  30 tablet  6   No current facility-administered medications for this visit.    Allergies:   No Known Allergies  Social History:  The patient  reports that he has been smoking Cigarettes.  He has been smoking about 0.00 packs per day for the past 5 years. He has never used smokeless tobacco. He  reports that  drinks alcohol. He reports that he uses illicit drugs (Benzodiazepines, Marijuana, Oxycodone, and Hydrocodone).   ROS:  Please see the history of present illness.  He is concerned about significant anger issues and emotional stress.     All other systems reviewed and negative.   PHYSICAL EXAM: VS:  BP 120/80  Pulse 66  Ht 5\' 7"  (1.702 m)  Wt 244 lb (110.678 kg)  BMI 38.21 kg/m2 Well nourished, well developed, in no acute distress HEENT: normal Neck: no JVD Cardiac:  normal S1, S2; RRR; no murmur Lungs:  clear to auscultation bilaterally, no wheezing, rhonchi or rales Abd: soft, nontender, no hepatomegaly Ext: no edema    Skin: warm and dry Neuro:  CNs 2-12 intact, no focal abnormalities noted  EKG:  NSR, HR 66, normal axis, anterolateral T wave inversions, no significant change     ASSESSMENT AND PLAN:  1. CAD:  No angina.  We discussed the importance of dual antiplatelet therapy.  We will provide him with more samples of Effient as well as ASA.  Continue statin.   2. Ischemic CM:  Continue beta blocker and ACE.  Echo pending in 02/2013 to f/u on EF. 3. NSVT:  He remains on his LifeVest until EF is reassessed.   4. Hyperlipidemia:  Continue statin.     5. Hypertension:  Controlled. 6. Substance Abuse:  He admits to abstinence. 7. Anxiety:  He will establish with a PCP through his Medicaid.  8. Disposition:  F/u with Dr. Peter Swaziland 02/2103 after his echo.    Signed, Tereso Newcomer, PA-C  12/29/2012 10:34 AM

## 2012-12-29 NOTE — Patient Instructions (Addendum)
You have been referred to  [facility]  MAKE SURE TO KEEP YOUR FOLLOW UP WITH DR. Swaziland

## 2013-01-01 ENCOUNTER — Telehealth: Payer: Self-pay | Admitting: *Deleted

## 2013-01-01 NOTE — Telephone Encounter (Signed)
lvm with family member to have ptcb. I was calling him in reference to his application to [facility] for Effient was denied to he has insurance.

## 2013-01-06 ENCOUNTER — Ambulatory Visit: Payer: Self-pay | Admitting: Family Medicine

## 2013-01-13 ENCOUNTER — Other Ambulatory Visit: Payer: Medicaid Other

## 2013-01-14 ENCOUNTER — Ambulatory Visit (INDEPENDENT_AMBULATORY_CARE_PROVIDER_SITE_OTHER): Payer: Medicaid Other

## 2013-01-14 DIAGNOSIS — E785 Hyperlipidemia, unspecified: Secondary | ICD-10-CM

## 2013-01-14 DIAGNOSIS — I10 Essential (primary) hypertension: Secondary | ICD-10-CM

## 2013-01-14 LAB — HEPATIC FUNCTION PANEL
ALT: 38 U/L (ref 0–53)
AST: 24 U/L (ref 0–37)
Total Bilirubin: 1 mg/dL (ref 0.3–1.2)

## 2013-01-14 LAB — LIPID PANEL
Cholesterol: 186 mg/dL (ref 0–200)
Total CHOL/HDL Ratio: 5

## 2013-01-15 LAB — LDL CHOLESTEROL, DIRECT: Direct LDL: 103.7 mg/dL

## 2013-01-19 ENCOUNTER — Telehealth: Payer: Self-pay | Admitting: *Deleted

## 2013-01-19 NOTE — Telephone Encounter (Signed)
Message copied by Tarri Fuller on Mon Jan 19, 2013 12:07 PM ------      Message from: [place], [place] T      Created: Sat Jan 17, 2013 10:49 PM       Is he taking Lipitor 80 QD every day??      Tereso Newcomer, PA-C        01/17/2013 10:48 PM ------

## 2013-01-19 NOTE — Telephone Encounter (Signed)
lmptcb go over lab results, verify if taking cholestrol med daily

## 2013-01-20 ENCOUNTER — Telehealth: Payer: Self-pay | Admitting: Cardiology

## 2013-01-20 NOTE — Telephone Encounter (Signed)
Returned call to patient no answer.LMTC. 

## 2013-01-20 NOTE — Telephone Encounter (Signed)
lmptcb go over lab results; mother has been notified about the results as well as pt has been denied from [employer]; mother said she will have ptcb 

## 2013-01-20 NOTE — Telephone Encounter (Signed)
Follow up ° ° °Pt returning your call °

## 2013-01-20 NOTE — Telephone Encounter (Signed)
lmptcb go over lab results; mother has been notified about the results as well as pt has been denied from [facility]; mother said she will have ptcb

## 2013-01-20 NOTE — Telephone Encounter (Signed)
New Problem  Pt returning call about test results and medications.. Request a call back

## 2013-01-21 NOTE — Telephone Encounter (Signed)
Follow up ° ° ° °Pt returning your call. Please call pt °

## 2013-01-21 NOTE — Telephone Encounter (Signed)
Returned call to patient will have Dr.Jordan review recent lipid panel and call back tomorrow 01/22/13.

## 2013-01-23 ENCOUNTER — Ambulatory Visit: Payer: Self-pay

## 2013-01-23 NOTE — Telephone Encounter (Signed)
Returned call to patient 01/22/13,Dr.Jordan reviewed recent lipid panel and advised to continue Lipitor 80 mg daily.Start Fish Oil 1000 mg 2 capsules daily.

## 2013-01-29 ENCOUNTER — Ambulatory Visit (HOSPITAL_COMMUNITY): Payer: Self-pay

## 2013-01-30 ENCOUNTER — Ambulatory Visit (HOSPITAL_COMMUNITY): Payer: Self-pay

## 2013-02-04 ENCOUNTER — Ambulatory Visit (HOSPITAL_COMMUNITY): Payer: Self-pay

## 2013-02-05 ENCOUNTER — Ambulatory Visit (HOSPITAL_COMMUNITY): Payer: Self-pay

## 2013-02-06 ENCOUNTER — Ambulatory Visit (HOSPITAL_COMMUNITY): Payer: Self-pay

## 2013-02-09 ENCOUNTER — Ambulatory Visit (HOSPITAL_COMMUNITY): Payer: Self-pay

## 2013-02-11 ENCOUNTER — Ambulatory Visit (HOSPITAL_COMMUNITY): Payer: Self-pay

## 2013-02-11 ENCOUNTER — Encounter: Payer: Self-pay | Admitting: *Deleted

## 2013-02-13 ENCOUNTER — Ambulatory Visit (HOSPITAL_COMMUNITY): Payer: Self-pay

## 2013-02-13 ENCOUNTER — Telehealth: Payer: Self-pay

## 2013-02-13 NOTE — Telephone Encounter (Signed)
Received call from patient's wife she stated she was calling back to get patient's lab result results.Lab results given.Advised to continue Lipitor 80 mg daily and start fish oil 1000 mg 2 capsules daily.Also wife was told patient was denied [facility] for Effient.Effient 10 mg samples left at 3rd floor front desk.

## 2013-02-16 ENCOUNTER — Ambulatory Visit (HOSPITAL_COMMUNITY): Payer: Self-pay

## 2013-02-18 ENCOUNTER — Ambulatory Visit (HOSPITAL_COMMUNITY): Payer: Self-pay

## 2013-02-19 ENCOUNTER — Other Ambulatory Visit (HOSPITAL_COMMUNITY): Payer: Medicaid Other

## 2013-02-19 ENCOUNTER — Ambulatory Visit (HOSPITAL_COMMUNITY): Payer: Self-pay

## 2013-02-20 ENCOUNTER — Ambulatory Visit (HOSPITAL_COMMUNITY): Payer: Self-pay

## 2013-02-23 ENCOUNTER — Ambulatory Visit (HOSPITAL_COMMUNITY): Payer: Self-pay

## 2013-02-25 ENCOUNTER — Ambulatory Visit (HOSPITAL_COMMUNITY): Payer: Self-pay

## 2013-02-26 ENCOUNTER — Ambulatory Visit: Payer: Medicaid Other | Admitting: Cardiology

## 2013-02-27 ENCOUNTER — Ambulatory Visit (HOSPITAL_COMMUNITY): Payer: Self-pay

## 2013-03-02 ENCOUNTER — Ambulatory Visit (HOSPITAL_COMMUNITY): Payer: Self-pay

## 2013-03-04 ENCOUNTER — Ambulatory Visit (HOSPITAL_COMMUNITY): Payer: Self-pay

## 2013-03-06 ENCOUNTER — Ambulatory Visit (HOSPITAL_COMMUNITY): Payer: Self-pay

## 2013-03-09 ENCOUNTER — Ambulatory Visit (HOSPITAL_COMMUNITY): Payer: Self-pay

## 2013-03-11 ENCOUNTER — Ambulatory Visit (HOSPITAL_COMMUNITY): Payer: Self-pay

## 2013-03-13 ENCOUNTER — Ambulatory Visit (HOSPITAL_COMMUNITY): Payer: Self-pay

## 2013-03-16 ENCOUNTER — Ambulatory Visit (HOSPITAL_COMMUNITY): Payer: Self-pay

## 2013-03-18 ENCOUNTER — Ambulatory Visit (HOSPITAL_COMMUNITY): Payer: Self-pay

## 2013-03-20 ENCOUNTER — Ambulatory Visit (HOSPITAL_COMMUNITY): Payer: Self-pay

## 2013-03-20 ENCOUNTER — Other Ambulatory Visit (HOSPITAL_COMMUNITY): Payer: Self-pay | Admitting: Cardiology

## 2013-03-20 ENCOUNTER — Ambulatory Visit (HOSPITAL_COMMUNITY): Payer: Medicaid Other | Attending: Internal Medicine | Admitting: Cardiology

## 2013-03-20 ENCOUNTER — Encounter (INDEPENDENT_AMBULATORY_CARE_PROVIDER_SITE_OTHER): Payer: Self-pay

## 2013-03-20 DIAGNOSIS — F17200 Nicotine dependence, unspecified, uncomplicated: Secondary | ICD-10-CM | POA: Insufficient documentation

## 2013-03-20 DIAGNOSIS — I255 Ischemic cardiomyopathy: Secondary | ICD-10-CM

## 2013-03-20 DIAGNOSIS — I2589 Other forms of chronic ischemic heart disease: Secondary | ICD-10-CM | POA: Insufficient documentation

## 2013-03-20 DIAGNOSIS — I10 Essential (primary) hypertension: Secondary | ICD-10-CM | POA: Insufficient documentation

## 2013-03-20 DIAGNOSIS — I2109 ST elevation (STEMI) myocardial infarction involving other coronary artery of anterior wall: Secondary | ICD-10-CM

## 2013-03-20 DIAGNOSIS — I2510 Atherosclerotic heart disease of native coronary artery without angina pectoris: Secondary | ICD-10-CM | POA: Insufficient documentation

## 2013-03-20 NOTE — Progress Notes (Signed)
Echo performed. 

## 2013-03-23 ENCOUNTER — Ambulatory Visit (HOSPITAL_COMMUNITY): Payer: Self-pay

## 2013-03-25 ENCOUNTER — Telehealth: Payer: Self-pay | Admitting: Cardiology

## 2013-03-25 ENCOUNTER — Ambulatory Visit (HOSPITAL_COMMUNITY): Payer: Self-pay

## 2013-03-25 NOTE — Telephone Encounter (Signed)
Returned call to patient spoke to wife Dr.Jordan advised he can stop wearing life vest.Appointment scheduled with Dr.Jordan 04/01/13.

## 2013-03-25 NOTE — Telephone Encounter (Signed)
New Problem  Pt calling for ECHO results and confirmation that he has been approved to stop wearing the life vest// Please advise

## 2013-03-27 ENCOUNTER — Ambulatory Visit (HOSPITAL_COMMUNITY): Payer: Self-pay

## 2013-03-30 ENCOUNTER — Ambulatory Visit (HOSPITAL_COMMUNITY): Payer: Self-pay

## 2013-04-01 ENCOUNTER — Encounter: Payer: Self-pay | Admitting: Cardiology

## 2013-04-01 ENCOUNTER — Ambulatory Visit (HOSPITAL_COMMUNITY): Payer: Self-pay

## 2013-04-01 ENCOUNTER — Ambulatory Visit (INDEPENDENT_AMBULATORY_CARE_PROVIDER_SITE_OTHER): Payer: Medicaid Other | Admitting: Cardiology

## 2013-04-01 VITALS — BP 124/60 | HR 80 | Ht 67.0 in | Wt 239.0 lb

## 2013-04-01 DIAGNOSIS — I519 Heart disease, unspecified: Secondary | ICD-10-CM

## 2013-04-01 DIAGNOSIS — E782 Mixed hyperlipidemia: Secondary | ICD-10-CM

## 2013-04-01 DIAGNOSIS — I255 Ischemic cardiomyopathy: Secondary | ICD-10-CM

## 2013-04-01 DIAGNOSIS — I2510 Atherosclerotic heart disease of native coronary artery without angina pectoris: Secondary | ICD-10-CM

## 2013-04-01 DIAGNOSIS — I10 Essential (primary) hypertension: Secondary | ICD-10-CM

## 2013-04-01 DIAGNOSIS — I2589 Other forms of chronic ischemic heart disease: Secondary | ICD-10-CM

## 2013-04-01 NOTE — Patient Instructions (Signed)
Continue your current medication  Work on weight loss and exercise   I will see you in 4 months with fasting lab work

## 2013-04-01 NOTE — Progress Notes (Signed)
Mitchell Stokes Date of Birth: 1985-12-14 Medical Record #[id number]  History of Present Illness: Mitchell Stokes is seen for followup today. He was admitted in June of 2014 with a non-STEMI. Cardiac catheterization demonstrated occlusion of the ostium of the LAD with right-to-left collaterals. Ejection fraction was reduced at 40% by cath. Echocardiogram showed an ejection fraction of 30-35%. He was initially treated medically but because of recurrent chest pain and ST elevation he underwent stenting of the ostium of the LAD with a drug-eluting stent. This was complicated by distal embolization to the third obtuse marginal vessel. PTCA was performed with modest improvement but continued distal occlusion of the OM 3. Since his hospitalization he has made lifestyle modification. He states he is no longer smoking. He is exercising by roller skating and playing basketball. He denies any recurrent chest pain. He does note a little bit of shortness of breath if he overexerts himself. He denies any palpitations or dizziness. He was wearing a life fast until his recent echo report. He is tolerating his medications well.  Current Outpatient Prescriptions on File Prior to Visit  Medication Sig Dispense Refill  . aspirin EC 81 MG EC tablet Take 1 tablet (81 mg total) by mouth daily.      Marland Kitchen atorvastatin (LIPITOR) 80 MG tablet Take 1 tablet (80 mg total) by mouth daily at 6 PM.  30 tablet  6  . carvedilol (COREG) 12.5 MG tablet Take 1 tablet (12.5 mg total) by mouth 2 (two) times daily with a meal.  60 tablet  6  . lansoprazole (PREVACID) 15 MG capsule Take 15 mg by mouth daily.      Marland Kitchen lisinopril (PRINIVIL,ZESTRIL) 10 MG tablet Take 1 tablet (10 mg total) by mouth daily.  30 tablet  11  . nitroGLYCERIN (NITROSTAT) 0.4 MG SL tablet Place 1 tablet (0.4 mg total) under the tongue every 5 (five) minutes as needed for chest pain.  25 tablet  3  . Omega-3 Fatty Acids (FISH OIL) 1000 MG CPDR Take 1,000 mg by mouth 2 (two)  times daily.  60 capsule  6  . prasugrel (EFFIENT) 10 MG TABS Take 1 tablet (10 mg total) by mouth daily.  30 tablet  6   No current facility-administered medications on file prior to visit.    No Known Allergies  Past Medical History  Diagnosis Date  . CAD (coronary artery disease)     a. 11/2012 Ant STEMI/Cath/PCI: LM nl, LAD 100p, RI nl, LCX n, RCA nl, EF 40%;  b. After initial trial of Med Rx, pt had further c/p and Ant ST elevation -> Prox LAD stented with 3.0x23 Xience DES, complicated by emoblization down OM3;  c. Recurrent ST elevation and c/p->Cath: patent LAD stent, PTCA of OM3->integrilin x 36 hrs.  . Ischemic cardiomyopathy     a. 11/2012 Echo: EF 30-35%, mild LVH, mid-distal anteroseptal and apical AK, mildly dil LA.  Marland Kitchen Hypertension   . GERD (gastroesophageal reflux disease)   . Anxiety   . Polysubstance abuse     a. tobacco, marijuana, etoh, Rx narcotics  . NSVT (nonsustained ventricular tachycardia)     a. 11/2012 Lifevest placed @ d/c.    Past Surgical History  Procedure Laterality Date  . None      History  Smoking status  . Current Some Day Smoker -- 5 years  . Types: Cigarettes  Smokeless tobacco  . Never Used    Comment: One cigarette per day, unless drinking then a pack/day  History  Alcohol Use  . Yes    Comment: drinks "tequila" on the weekends    Family History  Problem Relation Age of Onset  . Hypertension Mother   . Stroke Mother     Review of Systems: As noted in history of present illness.  All other systems were reviewed and are negative.  Physical Exam: BP 124/60  Pulse 80  Ht 5\' 7"  (1.702 m)  Wt 239 lb (108.41 kg)  BMI 37.42 kg/m2 He is an obese, young white male in no acute distress. HEENT: Normal Neck: No JVD or bruits. No adenopathy or thyromegaly. Lungs: Clear Cardiovascular: Regular rate and rhythm. Normal S1 and S2. No gallop or murmur. Abdomen: Soft and nontender. Bowel sounds are positive. Extremities: No cyanosis  or edema. Pedal pulses are 2+. Skin: Warm and dry Neuro: Alert and oriented x3. Cranial nerves II through XII are intact.  LABORATORY DATA: Echo:Study Conclusions  Left ventricle: The cavity size was normal. Wall thickness was normal. Systolic function was normal. The estimated ejection fraction was in the range of 55% to 60%.   Assessment / Plan: 1. Coronary disease status post stenting of the ostium of the LAD. Patient is asymptomatic. We will need to continue dual antiplatelet therapy until June of 2015. 2. Ischemic cardiomyopathy. This has resolved almost completely with normalization of his LV function. We have discontinued lifevest. Continue beta blocker and ACE inhibitor therapy. 3. Hyperlipidemia. Continue high-dose statin therapy. Continue to work on dietary modification and weight loss. 4. Hypertension-controlled. 5. History of substance abuse. Patient claims abstinence.  I will followup again in 4 months and we will check fasting lab work at that time.

## 2013-04-03 ENCOUNTER — Ambulatory Visit (HOSPITAL_COMMUNITY): Payer: Self-pay

## 2013-04-06 ENCOUNTER — Ambulatory Visit (HOSPITAL_COMMUNITY): Payer: Self-pay

## 2013-04-08 ENCOUNTER — Ambulatory Visit (HOSPITAL_COMMUNITY): Payer: Self-pay

## 2013-04-10 ENCOUNTER — Ambulatory Visit (HOSPITAL_COMMUNITY): Payer: Self-pay

## 2013-04-13 ENCOUNTER — Ambulatory Visit (HOSPITAL_COMMUNITY): Payer: Self-pay

## 2013-04-15 ENCOUNTER — Ambulatory Visit (HOSPITAL_COMMUNITY): Payer: Self-pay

## 2013-04-17 ENCOUNTER — Ambulatory Visit (HOSPITAL_COMMUNITY): Payer: Self-pay

## 2013-04-20 ENCOUNTER — Ambulatory Visit (HOSPITAL_COMMUNITY): Payer: Self-pay

## 2013-04-22 ENCOUNTER — Ambulatory Visit (HOSPITAL_COMMUNITY): Payer: Self-pay

## 2013-04-23 ENCOUNTER — Telehealth: Payer: Self-pay | Admitting: Cardiology

## 2013-04-23 NOTE — Telephone Encounter (Signed)
New problem    Pt would like a prescription for and antacid please that he can get with his medicaid.   He know's he can get it over the counter but he can not afford it.

## 2013-04-23 NOTE — Telephone Encounter (Signed)
Returned call to patient no answer.LMTC. 

## 2013-04-24 ENCOUNTER — Ambulatory Visit (HOSPITAL_COMMUNITY): Payer: Self-pay

## 2013-04-24 NOTE — Telephone Encounter (Signed)
Returned call to patient no answer.LMTC. 

## 2013-04-27 ENCOUNTER — Ambulatory Visit (HOSPITAL_COMMUNITY): Payer: Self-pay

## 2013-04-27 MED ORDER — PANTOPRAZOLE SODIUM 40 MG PO TBEC: 40.0000 mg | DELAYED_RELEASE_TABLET | Freq: Every day | ORAL | Status: DC

## 2013-04-27 NOTE — Telephone Encounter (Signed)
Received call from patient Dr.Jordan advised may take Protonix 40 mg daily.Prescription sent to pharmacy.

## 2013-04-29 ENCOUNTER — Ambulatory Visit (HOSPITAL_COMMUNITY): Payer: Self-pay

## 2013-05-04 ENCOUNTER — Ambulatory Visit (HOSPITAL_COMMUNITY): Payer: Self-pay

## 2013-05-06 ENCOUNTER — Ambulatory Visit (HOSPITAL_COMMUNITY): Payer: Self-pay

## 2013-05-08 ENCOUNTER — Ambulatory Visit (HOSPITAL_COMMUNITY): Payer: Self-pay

## 2013-05-11 ENCOUNTER — Ambulatory Visit (HOSPITAL_COMMUNITY): Payer: Self-pay

## 2013-05-13 ENCOUNTER — Ambulatory Visit (HOSPITAL_COMMUNITY): Payer: Self-pay

## 2013-06-08 ENCOUNTER — Telehealth: Payer: Self-pay

## 2013-06-08 NOTE — Telephone Encounter (Signed)
Patient called for samples of effient placed at front desk

## 2013-06-24 ENCOUNTER — Telehealth: Payer: Self-pay

## 2013-06-24 NOTE — Telephone Encounter (Signed)
Patient call for samples of effient placed a 30 day supply at front desk

## 2013-07-19 ENCOUNTER — Other Ambulatory Visit: Payer: Self-pay | Admitting: Nurse Practitioner

## 2013-08-03 ENCOUNTER — Encounter: Payer: Self-pay | Admitting: Cardiology

## 2013-08-03 ENCOUNTER — Ambulatory Visit (INDEPENDENT_AMBULATORY_CARE_PROVIDER_SITE_OTHER): Payer: Medicaid Other | Admitting: Cardiology

## 2013-08-03 VITALS — BP 127/79 | HR 80 | Ht 67.0 in | Wt 236.0 lb

## 2013-08-03 DIAGNOSIS — E782 Mixed hyperlipidemia: Secondary | ICD-10-CM

## 2013-08-03 DIAGNOSIS — I519 Heart disease, unspecified: Secondary | ICD-10-CM

## 2013-08-03 DIAGNOSIS — I10 Essential (primary) hypertension: Secondary | ICD-10-CM

## 2013-08-03 DIAGNOSIS — I255 Ischemic cardiomyopathy: Secondary | ICD-10-CM

## 2013-08-03 DIAGNOSIS — I2589 Other forms of chronic ischemic heart disease: Secondary | ICD-10-CM

## 2013-08-03 DIAGNOSIS — I2510 Atherosclerotic heart disease of native coronary artery without angina pectoris: Secondary | ICD-10-CM

## 2013-08-03 NOTE — Patient Instructions (Signed)
Continue your current therapy  I will see you in 6 months with fasting lab work  Keep up with your exercise and healthy diet. Cut out the alcohol.

## 2013-08-03 NOTE — Progress Notes (Signed)
Mitchell Stokes Date of Birth: [birth year] Medical Record #[id number]  History of Present Illness: Mitchell Stokes is seen for followup today. He was admitted in June of 2014 with a non-STEMI. Cardiac catheterization demonstrated occlusion of the ostium of the LAD with right-to-left collaterals. Ejection fraction was reduced at 40% by cath. Echocardiogram showed an ejection fraction of 30-35%. He was initially treated medically but because of recurrent chest pain and ST elevation he underwent stenting of the ostium of the LAD with a drug-eluting stent. This was complicated by distal embolization to the third obtuse marginal vessel. PTCA was performed with modest improvement but continued distal occlusion of the OM 3. After his hospitalization he did make lifestyle modifications. He states he is no longer smoking. He denies any recurrent chest pain. He has been more depressed recently due to his girlfriend leaving him. He started drinking again. 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  Current Outpatient Prescriptions on File Prior to Visit  Medication Sig Dispense Refill  . aspirin EC 81 MG EC tablet Take 1 tablet (81 mg total) by mouth daily.      Marland Kitchen atorvastatin (LIPITOR) 80 MG tablet TAKE 1 TABLET BY MOUTH ONCE DAILY AT 6 PM  90 tablet  0  . carvedilol (COREG) 12.5 MG tablet Take 1 tablet (12.5 mg total) by mouth 2 (two) times daily with a meal.  60 tablet  6  . lisinopril (PRINIVIL,ZESTRIL) 10 MG tablet Take 1 tablet (10 mg total) by mouth daily.  30 tablet  11  . nitroGLYCERIN (NITROSTAT) 0.4 MG SL tablet Place 1 tablet (0.4 mg total) under the tongue every 5 (five) minutes as needed for chest pain.  25 tablet  3  . Omega-3 Fatty Acids (FISH OIL) 1000 MG CPDR Take 1,000 mg by mouth 2 (two) times daily.  60 capsule  6  . pantoprazole (PROTONIX) 40 MG tablet Take 1 tablet (40 mg total) by mouth daily.  30 tablet  6  . prasugrel (EFFIENT) 10 MG TABS Take 1 tablet (10 mg total) by mouth daily.  30  tablet  6   No current facility-administered medications on file prior to visit.    No Known Allergies  Past Medical History  Diagnosis Date  . CAD (coronary artery disease)     a. 11/2012 Ant STEMI/Cath/PCI: LM nl, LAD 100p, RI nl, LCX n, RCA nl, EF 40%;  b. After initial trial of Med Rx, pt had further c/p and Ant ST elevation -> Prox LAD stented with 3.0x23 Xience DES, complicated by emoblization down OM3;  c. Recurrent ST elevation and c/p->Cath: patent LAD stent, PTCA of OM3->integrilin x 36 hrs.  . Ischemic cardiomyopathy     a. 11/2012 Echo: EF 30-35%, mild LVH, mid-distal anteroseptal and apical AK, mildly dil LA.  Marland Kitchen Hypertension   . GERD (gastroesophageal reflux disease)   . Anxiety   . Polysubstance abuse     a. tobacco, marijuana, etoh, Rx narcotics  . NSVT (nonsustained ventricular tachycardia)     a. 11/2012 Lifevest placed @ d/c.    Past Surgical History  Procedure Laterality Date  . None      History  Smoking status  . Current Some Day Smoker -- 5 years  . Types: Cigarettes  Smokeless tobacco  . Never Used    Comment: One cigarette per day, unless drinking then a pack/day    History  Alcohol Use  . Yes    Comment: drinks "tequila" on the weekends  Family History  Problem Relation Age of Onset  . Hypertension Mother   . Stroke Mother     Review of Systems: As noted in history of present illness.  All other systems were reviewed and are negative.  Physical Exam: BP 127/79  Pulse 80  Ht 5\' 7"  (1.702 m)  Wt 236 lb (107.049 kg)  BMI 36.95 kg/m2 He is an obese, young white male in no acute distress. HEENT: Normal Neck: No JVD or bruits. No adenopathy or thyromegaly. Lungs: Clear Cardiovascular: Regular rate and rhythm. Normal S1 and S2. No gallop or murmur. Abdomen: Soft and nontender. Bowel sounds are positive. Extremities: No cyanosis or edema. Pedal pulses are 2+. Skin: Warm and dry Neuro: Alert and oriented x3. Cranial nerves II through XII  are intact.  LABORATORY DATA:   Assessment / Plan: 1. Coronary disease status post stenting of the ostium of the LAD. Patient is asymptomatic. We will need to continue dual antiplatelet therapy until June of 2015. 2. Ischemic cardiomyopathy. This has resolved almost completely with normalization of his LV function by Echo. Continue beta blocker and ACE inhibitor therapy. 3. Hyperlipidemia. Continue high-dose statin therapy. Continue to work on dietary modification and weight loss. 4. Hypertension-controlled. 5. History of substance abuse. Patient has resumed Alcohol use. He knows this is counterproductive and harmful. He is making an effort to eat healthy and is going to try and quit drinking.   I will followup again in 6 months and we will check fasting lab work at that time.

## 2013-09-07 ENCOUNTER — Emergency Department (HOSPITAL_COMMUNITY)
Admission: EM | Admit: 2013-09-07 | Discharge: 2013-09-07 | Disposition: A | Discharge status: Home / Self Care | Payer: Medicaid Other | Attending: Emergency Medicine | Admitting: Emergency Medicine

## 2013-09-07 ENCOUNTER — Encounter (HOSPITAL_COMMUNITY): Payer: Self-pay | Admitting: Emergency Medicine

## 2013-09-07 DIAGNOSIS — I2510 Atherosclerotic heart disease of native coronary artery without angina pectoris: Secondary | ICD-10-CM | POA: Insufficient documentation

## 2013-09-07 DIAGNOSIS — Z8659 Personal history of other mental and behavioral disorders: Secondary | ICD-10-CM | POA: Insufficient documentation

## 2013-09-07 DIAGNOSIS — I10 Essential (primary) hypertension: Secondary | ICD-10-CM | POA: Insufficient documentation

## 2013-09-07 DIAGNOSIS — K529 Noninfective gastroenteritis and colitis, unspecified: Secondary | ICD-10-CM

## 2013-09-07 DIAGNOSIS — Z7982 Long term (current) use of aspirin: Secondary | ICD-10-CM | POA: Insufficient documentation

## 2013-09-07 DIAGNOSIS — Z79899 Other long term (current) drug therapy: Secondary | ICD-10-CM | POA: Insufficient documentation

## 2013-09-07 DIAGNOSIS — K219 Gastro-esophageal reflux disease without esophagitis: Secondary | ICD-10-CM | POA: Insufficient documentation

## 2013-09-07 DIAGNOSIS — K5289 Other specified noninfective gastroenteritis and colitis: Secondary | ICD-10-CM | POA: Insufficient documentation

## 2013-09-07 DIAGNOSIS — E860 Dehydration: Secondary | ICD-10-CM | POA: Insufficient documentation

## 2013-09-07 DIAGNOSIS — F17200 Nicotine dependence, unspecified, uncomplicated: Secondary | ICD-10-CM | POA: Insufficient documentation

## 2013-09-07 LAB — URINE MICROSCOPIC-ADD ON

## 2013-09-07 LAB — CBC WITH DIFFERENTIAL/PLATELET
BASOS ABS: 0 10*3/uL (ref 0.0–0.1)
BASOS PCT: 1 % (ref 0–1)
EOS ABS: 0.1 10*3/uL (ref 0.0–0.7)
EOS PCT: 1 % (ref 0–5)
HEMATOCRIT: 47.1 % (ref 39.0–52.0)
Hemoglobin: 16.9 g/dL (ref 13.0–17.0)
Lymphocytes Relative: 10 % — ABNORMAL LOW (ref 12–46)
Lymphs Abs: 0.8 10*3/uL (ref 0.7–4.0)
MCH: 30.7 pg (ref 26.0–34.0)
MCHC: 35.9 g/dL (ref 30.0–36.0)
MCV: 85.6 fL (ref 78.0–100.0)
MONO ABS: 0.9 10*3/uL (ref 0.1–1.0)
Monocytes Relative: 10 % (ref 3–12)
Neutro Abs: 6.9 10*3/uL (ref 1.7–7.7)
Neutrophils Relative %: 78 % — ABNORMAL HIGH (ref 43–77)
PLATELETS: 233 10*3/uL (ref 150–400)
RBC: 5.5 MIL/uL (ref 4.22–5.81)
RDW: 12.4 % (ref 11.5–15.5)
WBC: 8.8 10*3/uL (ref 4.0–10.5)

## 2013-09-07 LAB — URINALYSIS, ROUTINE W REFLEX MICROSCOPIC
GLUCOSE, UA: NEGATIVE mg/dL
Hgb urine dipstick: NEGATIVE
Ketones, ur: 15 mg/dL — AB
Nitrite: NEGATIVE
PROTEIN: 30 mg/dL — AB
SPECIFIC GRAVITY, URINE: 1.037 — AB (ref 1.005–1.030)
Urobilinogen, UA: 1 mg/dL (ref 0.0–1.0)
pH: 6 (ref 5.0–8.0)

## 2013-09-07 LAB — COMPREHENSIVE METABOLIC PANEL
ALBUMIN: 4.2 g/dL (ref 3.5–5.2)
ALT: 53 U/L (ref 0–53)
AST: 28 U/L (ref 0–37)
Alkaline Phosphatase: 103 U/L (ref 39–117)
BILIRUBIN TOTAL: 0.7 mg/dL (ref 0.3–1.2)
BUN: 15 mg/dL (ref 6–23)
CALCIUM: 9.5 mg/dL (ref 8.4–10.5)
CO2: 20 mEq/L (ref 19–32)
CREATININE: 0.95 mg/dL (ref 0.50–1.35)
Chloride: 100 mEq/L (ref 96–112)
GFR calc Af Amer: >90 mL/min (ref >90)
GFR calc non Af Amer: >90 mL/min (ref >90)
Glucose, Bld: 115 mg/dL — ABNORMAL HIGH (ref 70–99)
Potassium: 3.9 mEq/L (ref 3.7–5.3)
Sodium: 140 mEq/L (ref 137–147)
TOTAL PROTEIN: 7.7 g/dL (ref 6.0–8.3)

## 2013-09-07 LAB — LIPASE, BLOOD: LIPASE: 30 U/L (ref 11–59)

## 2013-09-07 MED ORDER — LORAZEPAM 1 MG PO TABS: 0.5000 mg | ORAL_TABLET | Freq: Once | ORAL | Status: DC

## 2013-09-07 MED ORDER — ONDANSETRON HCL 4 MG/2ML IJ SOLN
4.0000 mg | Freq: Once | INTRAMUSCULAR | Status: AC
  Administered 2013-09-07: 4 mg via INTRAVENOUS
  Filled 2013-09-07: qty 2

## 2013-09-07 MED ORDER — MORPHINE SULFATE 4 MG/ML IJ SOLN
6.0000 mg | Freq: Once | INTRAMUSCULAR | Status: AC
  Administered 2013-09-07: 6 mg via INTRAVENOUS
  Filled 2013-09-07: qty 2

## 2013-09-07 MED ORDER — ONDANSETRON 4 MG PO TBDP: 4.0000 mg | ORAL_TABLET | Freq: Three times a day (TID) | ORAL | Status: DC | PRN

## 2013-09-07 MED ORDER — SODIUM CHLORIDE 0.9 % IV BOLUS (SEPSIS)
500.0000 mL | Freq: Once | INTRAVENOUS | Status: AC
  Administered 2013-09-07: 500 mL via INTRAVENOUS

## 2013-09-07 NOTE — ED Notes (Signed)
Pt states he has been having diarrhea and vomiting since yesterday after having a burger yesterday that was still pink in the middle. States his BM's are still liquid. He is just dry heaving now.

## 2013-09-07 NOTE — ED Provider Notes (Signed)
Medical screening examination/treatment/procedure(s) were conducted as a shared visit with non-physician practitioner(s) or resident and myself. I personally evaluated the patient during the encounter and agree with the findings and plan unless otherwise indicated.  I have personally reviewed any xrays and/ or EKG's with the provider and I agree with interpretation.  Patient with ischemic cardiomyopathy history presents with recurrent diarrhea and vomiting since yesterday. Took place after eating a hamburger. No sick contacts or travel. No cp. No known gallbladder history. Nothing has improved his symptoms. Intermittent without bleeding. On exam patient has mild diffuse abdominal tenderness with increased bowel sounds, no guarding, mild dry mucous membranes. Regular rate and rhythm, lungs clear. Plan for IV fluids, antiemetics, basic lab work. Discussed this is likely gastroenteritis. We also discussed reasons to return in case this is atypical or early appendicitis or biliary disease.  Vomiting, Diarrhea, Abdominal pain    Enid SkeensJoshua M Keilly Fatula, MD 09/07/13 [phone]

## 2013-09-07 NOTE — Discharge Instructions (Signed)
Viral Gastroenteritis Viral gastroenteritis is also known as stomach flu. This condition affects the stomach and intestinal tract. It can cause sudden diarrhea and vomiting. The illness typically lasts 3 to 8 days. Most people develop an immune response that eventually gets rid of the virus. While this natural response develops, the virus can make you quite ill. CAUSES  Many different viruses can cause gastroenteritis, such as rotavirus or noroviruses. You can catch one of these viruses by consuming contaminated food or water. You may also catch a virus by sharing utensils or other personal items with an infected person or by touching a contaminated surface. SYMPTOMS  The most common symptoms are diarrhea and vomiting. These problems can cause a severe loss of body fluids (dehydration) and a body salt (electrolyte) imbalance. Other symptoms may include:  Fever.  Headache.  Fatigue.  Abdominal pain. DIAGNOSIS  Your caregiver can usually diagnose viral gastroenteritis based on your symptoms and a physical exam. A stool sample may also be taken to test for the presence of viruses or other infections. TREATMENT  This illness typically goes away on its own. Treatments are aimed at rehydration. The most serious cases of viral gastroenteritis involve vomiting so severely that you are not able to keep fluids down. In these cases, fluids must be given through an intravenous line (IV). HOME CARE INSTRUCTIONS   Drink enough fluids to keep your urine clear or pale yellow. Drink small amounts of fluids frequently and increase the amounts as tolerated.  Ask your caregiver for specific rehydration instructions.  Avoid:  Foods high in sugar.  Alcohol.  Carbonated drinks.  Tobacco.  Juice.  Caffeine drinks.  Extremely hot or cold fluids.  Fatty, greasy foods.  Too much intake of anything at one time.  Dairy products until 24 to 48 hours after diarrhea stops.  You may consume probiotics.  Probiotics are active cultures of beneficial bacteria. They may lessen the amount and number of diarrheal stools in adults. Probiotics can be found in yogurt with active cultures and in supplements.  Wash your hands well to avoid spreading the virus.  Only take over-the-counter or prescription medicines for pain, discomfort, or fever as directed by your caregiver. Do not give aspirin to children. Antidiarrheal medicines are not recommended.  Ask your caregiver if you should continue to take your regular prescribed and over-the-counter medicines.  Keep all follow-up appointments as directed by your caregiver. SEEK IMMEDIATE MEDICAL CARE IF:   You are unable to keep fluids down.  You do not urinate at least once every 6 to 8 hours.  You develop shortness of breath.  You notice blood in your stool or vomit. This may look like coffee grounds.  You have abdominal pain that increases or is concentrated in one small area (localized).  You have persistent vomiting or diarrhea.  You have a fever.  The patient is a child younger than 3 months, and he or she has a fever.  The patient is a child older than 3 months, and he or she has a fever and persistent symptoms.  The patient is a child older than 3 months, and he or she has a fever and symptoms suddenly get worse.  The patient is a baby, and he or she has no tears when crying. MAKE SURE YOU:   Understand these instructions.  Will watch your condition.  Will get help right away if you are not doing well or get worse. Document Released: 05/21/2005 Document Revised: 08/13/2011 Document Reviewed: 03/07/2011   [employer] Patient Information 2014 [employer]  

## 2013-09-07 NOTE — ED Provider Notes (Signed)
CSN: [id number]     Arrival date & time 09/07/13  4098 History   First MD Initiated Contact with Patient 09/07/13 [phone]     Chief Complaint  Patient presents with  . Emesis  . Diarrhea     (Consider location/radiation/quality/duration/timing/severity/associated sxs/prior Treatment) Patient is a 28 y.o. male presenting with vomiting and diarrhea. The history is provided by the patient.  Emesis Severity:  Moderate Duration:  1 day Timing:  Constant Number of daily episodes:  >10 Quality:  Stomach contents Progression:  Unchanged Chronicity:  New Recent urination:  Normal Relieved by:  Nothing Worsened by:  Liquids Ineffective treatments:  None tried Associated symptoms: abdominal pain (after vomiting) and diarrhea   Associated symptoms: no cough, no fever, no headaches and no URI   Risk factors: alcohol use and suspect food intake (pt states he had a slightly undercooked hamburger yesterday)   Risk factors: no diabetes and no prior abdominal surgery   Diarrhea Associated symptoms: abdominal pain (after vomiting) and vomiting   Associated symptoms: no recent cough, no fever, no headaches and no URI     Past Medical History  Diagnosis Date  . CAD (coronary artery disease)     a. 11/2012 Ant STEMI/Cath/PCI: LM nl, LAD 100p, RI nl, LCX n, RCA nl, EF 40%;  b. After initial trial of Med Rx, pt had further c/p and Ant ST elevation -> Prox LAD stented with 3.0x23 Xience DES, complicated by emoblization down OM3;  c. Recurrent ST elevation and c/p->Cath: patent LAD stent, PTCA of OM3->integrilin x 36 hrs.  . Ischemic cardiomyopathy     a. 11/2012 Echo: EF 30-35%, mild LVH, mid-distal anteroseptal and apical AK, mildly dil LA.  Marland Kitchen Hypertension   . GERD (gastroesophageal reflux disease)   . Anxiety   . Polysubstance abuse     a. tobacco, marijuana, etoh, Rx narcotics  . NSVT (nonsustained ventricular tachycardia)     a. 11/2012 Lifevest placed @ d/c.   Past Surgical History  Procedure  Laterality Date  . None     Family History  Problem Relation Age of Onset  . Hypertension Mother   . Stroke Mother    History  Substance Use Topics  . Smoking status: Current Some Day Smoker -- 5 years    Types: Cigarettes  . Smokeless tobacco: Never Used     Comment: One cigarette per day, unless drinking then a pack/day  . Alcohol Use: Yes     Comment: drinks "tequila" on the weekends    Review of Systems  Constitutional: Negative for fever, activity change and appetite change.  HENT: Negative for congestion and rhinorrhea.   Eyes: Negative for discharge and itching.  Respiratory: Negative for cough, shortness of breath and wheezing.   Cardiovascular: Negative for chest pain.  Gastrointestinal: Positive for nausea, vomiting, abdominal pain (after vomiting) and diarrhea. Negative for constipation.  Genitourinary: Negative for hematuria, decreased urine volume and difficulty urinating.  Skin: Negative for rash and wound.  Neurological: Negative for syncope, weakness, numbness and headaches.  All other systems reviewed and are negative.      Allergies  Review of patient's allergies indicates no known allergies.  Home Medications   Current Outpatient Rx  Name  Route  Sig  Dispense  Refill  . aspirin EC 81 MG EC tablet   Oral   Take 1 tablet (81 mg total) by mouth daily.         Marland Kitchen atorvastatin (LIPITOR) 80 MG tablet  TAKE 1 TABLET BY MOUTH ONCE DAILY AT 6 PM   90 tablet   0     .Marland KitchenPatient needs to contact office to schedule  App ...   . carvedilol (COREG) 12.5 MG tablet   Oral   Take 1 tablet (12.5 mg total) by mouth 2 (two) times daily with a meal.   60 tablet   6   . Cyanocobalamin (VITAMIN B 12 PO)   Oral   Take 1 tablet by mouth daily.         Marland Kitchen lisinopril (PRINIVIL,ZESTRIL) 10 MG tablet   Oral   Take 1 tablet (10 mg total) by mouth daily.   30 tablet   11   . Omega-3 Fatty Acids (FISH OIL) 1000 MG CPDR   Oral   Take 1,000 mg by mouth 2  (two) times daily.   60 capsule   6   . pantoprazole (PROTONIX) 40 MG tablet   Oral   Take 1 tablet (40 mg total) by mouth daily.   30 tablet   6   . prasugrel (EFFIENT) 10 MG TABS   Oral   Take 1 tablet (10 mg total) by mouth daily.   30 tablet   6   . nitroGLYCERIN (NITROSTAT) 0.4 MG SL tablet   Sublingual   Place 1 tablet (0.4 mg total) under the tongue every 5 (five) minutes as needed for chest pain.   25 tablet   3   . ondansetron (ZOFRAN ODT) 4 MG disintegrating tablet   Oral   Take 1 tablet (4 mg total) by mouth every 8 (eight) hours as needed for nausea or vomiting.   20 tablet   0    BP 124/72  Pulse 71  Temp(Src) 97.7 F (36.5 C) (Oral)  Resp 18  Ht 5\' 7"  (1.702 m)  Wt 237 lb 4.8 oz (107.639 kg)  BMI 37.16 kg/m2  SpO2 95% Physical Exam  Vitals reviewed. Constitutional: He is oriented to person, place, and time. He appears well-developed and well-nourished. No distress.  Mildly dehydrated, NAD  HENT:  Head: Normocephalic and atraumatic.  Mouth/Throat: Oropharynx is clear and moist. No oropharyngeal exudate.  Eyes: Conjunctivae and EOM are normal. Pupils are equal, round, and reactive to light. Right eye exhibits no discharge. Left eye exhibits no discharge. No scleral icterus.  Neck: Normal range of motion. Neck supple.  Cardiovascular: Normal rate, regular rhythm, normal heart sounds and intact distal pulses.  Exam reveals no gallop and no friction rub.   No murmur heard. Pulmonary/Chest: Effort normal and breath sounds normal. No respiratory distress. He has no wheezes. He has no rales.  Abdominal: Soft. He exhibits no distension and no mass. There is tenderness (diffuse discomfort with no focal ttp, no rigidity, soft, neg murphys, negm cburneys).  Hyperactive bowel sounds  Musculoskeletal: Normal range of motion.  Neurological: He is alert and oriented to person, place, and time. No cranial nerve deficit. He exhibits normal muscle tone. Coordination  normal.  Skin: Skin is warm. No rash noted. He is not diaphoretic.    ED Course  Procedures (including critical care time) Labs Review Labs Reviewed  CBC WITH DIFFERENTIAL - Abnormal; Notable for the following:    Neutrophils Relative % 78 (*)    Lymphocytes Relative 10 (*)    All other components within normal limits  COMPREHENSIVE METABOLIC PANEL - Abnormal; Notable for the following:    Glucose, Bld 115 (*)    All other components within normal limits  URINALYSIS, ROUTINE W REFLEX MICROSCOPIC - Abnormal; Notable for the following:    Color, Urine AMBER (*)    Specific Gravity, Urine 1.037 (*)    Bilirubin Urine SMALL (*)    Ketones, ur 15 (*)    Protein, ur 30 (*)    Leukocytes, UA TRACE (*)    All other components within normal limits  URINE MICROSCOPIC-ADD ON - Abnormal; Notable for the following:    Bacteria, UA FEW (*)    All other components within normal limits  LIPASE, BLOOD   Imaging Review No results found.   EKG Interpretation None      MDM   MDM: 28 y.o. WM w/ cc: of N/v/d for 1 day. Pt states started last night. States had questionable food but is unsure of cause. Has some mild abd pain after the vomiting and diarrhea. > 10 episodes of nbnb vomiitng and nb diarrhea. Denies chest pain, SOB, urinary problems, f/c. AFVSS, well appaering, mildly dehydrated. Hyperactive bowel sounds, diffuse mild ttp, but no focal pain. No rigidity or peritonitis. Will hydrate, give pain and antiemetics, check labs. Likely gastro as has n/v/d with suspect food intake and hyperactive bowel sounds with benign abdomen. Labs checked, WNL. After antiemetics, analgesia, and fluids, pt feels much better. Abd soft, benign. Unlikely acute abdomen, but cautioned to return if develops worsening abd pain, uncontrolled vomiting, or fevers. Given rx for Zofran. Discharged. Care of case d/w my attending.  Final diagnoses:  Gastroenteritis    Discharged    Pilar Jarvisoug Brinsley Wence, MD 09/07/13 [phone]

## 2013-09-10 ENCOUNTER — Other Ambulatory Visit: Payer: Self-pay | Admitting: *Deleted

## 2013-09-10 MED ORDER — PRASUGREL HCL 10 MG PO TABS: 10.0000 mg | ORAL_TABLET | Freq: Every day | ORAL | Status: DC

## 2013-09-10 NOTE — Telephone Encounter (Signed)
Patient calling requesting samples Of Effient 10mg  LOT [id number]    EXP 12/15

## 2013-09-28 ENCOUNTER — Other Ambulatory Visit: Payer: Self-pay | Admitting: Nurse Practitioner

## 2013-10-27 ENCOUNTER — Telehealth: Payer: Self-pay

## 2013-10-27 NOTE — Telephone Encounter (Signed)
Patient called about samples of effient placed samples up front

## 2013-11-27 ENCOUNTER — Telehealth: Payer: Self-pay

## 2013-11-27 NOTE — Telephone Encounter (Signed)
Patient called for samples of effeint placed samples up 30 day supply

## 2013-12-07 ENCOUNTER — Other Ambulatory Visit: Payer: Self-pay | Admitting: Nurse Practitioner

## 2013-12-08 NOTE — Telephone Encounter (Signed)
Rx was sent to pharmacy electronically. 

## 2013-12-21 ENCOUNTER — Other Ambulatory Visit: Payer: Self-pay | Admitting: Cardiology

## 2013-12-21 NOTE — Telephone Encounter (Signed)
Peter M SwazilandJordan, MD at 08/03/2013 4:31 PM Patient Instructions  Continue your current therapy   pantoprazole (PROTONIX) 40 MG tablet  Take 1 tablet (40 mg total) by mouth daily.   30 tablet   6

## 2014-01-05 ENCOUNTER — Telehealth: Payer: Self-pay

## 2014-01-05 NOTE — Telephone Encounter (Signed)
PATIENT CALLED FOR SAMPLES OF EFFIENT PLACED UP FRONT

## 2014-02-03 ENCOUNTER — Telehealth: Payer: Self-pay

## 2014-02-03 ENCOUNTER — Ambulatory Visit: Payer: Self-pay | Admitting: Cardiology

## 2014-02-03 NOTE — Telephone Encounter (Signed)
Patient called for samples of effient also talked to him about patient assistance program

## 2014-02-24 ENCOUNTER — Other Ambulatory Visit: Payer: Self-pay

## 2014-02-24 DIAGNOSIS — I10 Essential (primary) hypertension: Secondary | ICD-10-CM

## 2014-02-24 MED ORDER — LISINOPRIL 10 MG PO TABS: 10.0000 mg | ORAL_TABLET | Freq: Every day | ORAL | Status: DC

## 2014-03-14 ENCOUNTER — Other Ambulatory Visit: Payer: Self-pay | Admitting: Cardiology

## 2014-03-15 ENCOUNTER — Telehealth: Payer: Self-pay | Admitting: *Deleted

## 2014-03-15 NOTE — Telephone Encounter (Signed)
Effient samples provided for patient as well as another patient assistance form as he stated that he lost the last one.

## 2014-03-30 ENCOUNTER — Ambulatory Visit: Payer: Self-pay | Admitting: Cardiology

## 2014-03-31 ENCOUNTER — Ambulatory Visit (INDEPENDENT_AMBULATORY_CARE_PROVIDER_SITE_OTHER): Payer: Medicaid Other | Admitting: Cardiology

## 2014-03-31 ENCOUNTER — Encounter: Payer: Self-pay | Admitting: Cardiology

## 2014-03-31 VITALS — BP 132/86 | HR 67 | Ht 70.0 in | Wt 246.9 lb

## 2014-03-31 DIAGNOSIS — R0600 Dyspnea, unspecified: Secondary | ICD-10-CM

## 2014-03-31 DIAGNOSIS — R059 Cough, unspecified: Secondary | ICD-10-CM | POA: Insufficient documentation

## 2014-03-31 DIAGNOSIS — E782 Mixed hyperlipidemia: Secondary | ICD-10-CM

## 2014-03-31 DIAGNOSIS — I2510 Atherosclerotic heart disease of native coronary artery without angina pectoris: Secondary | ICD-10-CM

## 2014-03-31 DIAGNOSIS — I10 Essential (primary) hypertension: Secondary | ICD-10-CM

## 2014-03-31 DIAGNOSIS — R05 Cough: Secondary | ICD-10-CM

## 2014-03-31 DIAGNOSIS — I255 Ischemic cardiomyopathy: Secondary | ICD-10-CM

## 2014-03-31 LAB — LIPID PANEL
Cholesterol: 132 mg/dL (ref 0–200)
HDL: 39 mg/dL — ABNORMAL LOW (ref >39)
LDL CALC: 49 mg/dL (ref 0–99)
TRIGLYCERIDES: 222 mg/dL — AB (ref <150)
Total CHOL/HDL Ratio: 3.4 Ratio
VLDL: 44 mg/dL — AB (ref 0–40)

## 2014-03-31 LAB — HEPATIC FUNCTION PANEL
ALBUMIN: 4.3 g/dL (ref 3.5–5.2)
ALT: 38 U/L (ref 0–53)
AST: 21 U/L (ref 0–37)
Alkaline Phosphatase: 89 U/L (ref 39–117)
Bilirubin, Direct: 0.2 mg/dL (ref 0.0–0.3)
Indirect Bilirubin: 0.5 mg/dL (ref 0.2–1.2)
TOTAL PROTEIN: 6.9 g/dL (ref 6.0–8.3)
Total Bilirubin: 0.7 mg/dL (ref 0.2–1.2)

## 2014-03-31 LAB — BASIC METABOLIC PANEL
BUN: 19 mg/dL (ref 6–23)
CHLORIDE: 102 meq/L (ref 96–112)
CO2: 26 mEq/L (ref 19–32)
Calcium: 9.3 mg/dL (ref 8.4–10.5)
Creat: 1.01 mg/dL (ref 0.50–1.35)
Glucose, Bld: 97 mg/dL (ref 70–99)
POTASSIUM: 4.4 meq/L (ref 3.5–5.3)
Sodium: 138 mEq/L (ref 135–145)

## 2014-03-31 MED ORDER — CLOPIDOGREL BISULFATE 75 MG PO TABS: 75.0000 mg | ORAL_TABLET | Freq: Every day | ORAL | Status: DC

## 2014-03-31 MED ORDER — LOSARTAN POTASSIUM 50 MG PO TABS: 50.0000 mg | ORAL_TABLET | Freq: Every day | ORAL | Status: DC

## 2014-03-31 MED ORDER — ALBUTEROL SULFATE HFA 108 (90 BASE) MCG/ACT IN AERS: 2.0000 | INHALATION_SPRAY | Freq: Four times a day (QID) | RESPIRATORY_TRACT | Status: AC | PRN

## 2014-03-31 NOTE — Patient Instructions (Addendum)
We will switch Effient to Plavix 75 mg daily  Stop lisinopril. We will switch to losartan 50 mg daily which doesn't cause a cough.  We will check lab work today  Stop smoking completely  Use albuterol inhaler 2 puffs every 6 hours as needed for shortness of breath or wheezing.  I will see you in 6 months.

## 2014-03-31 NOTE — Progress Notes (Signed)
Mitchell Stokes Date of Birth: 01-23-1986 Medical Record #[id number]  History of Present Illness: Mitchell Stokes is seen for followup today. Mitchell Stokes was admitted in June of 2014 with a non-STEMI. Cardiac catheterization demonstrated occlusion of the ostium of the LAD with right-to-left collaterals. Ejection fraction was reduced at 40% by cath. Echocardiogram showed an ejection fraction of 30-35%. Mitchell Stokes was initially treated medically but because of recurrent chest pain and ST elevation Mitchell Stokes underwent stenting of the ostium of the LAD with a drug-eluting stent. This was complicated by distal embolization to the third obtuse marginal vessel. PTCA was performed with modest improvement but continued distal occlusion of the OM 3.  On follow up today Mitchell Stokes states Mitchell Stokes is having some breathing difficulties. Mitchell Stokes gets SOB lying down at night but not with exertion. Mitchell Stokes does have a cough producdtive of occasional blackish sputum.Mitchell Stokes is smoking a few cigarettes per day and smoking marijuana. Mitchell Stokes reports Mitchell Stokes has reduced his Etoh consumption to a fifth/week. Mitchell Stokes denies any chest pain. Mitchell Stokes has gained 10 lbs.   Current Outpatient Prescriptions on File Prior to Visit  Medication Sig Dispense Refill  . aspirin EC 81 MG EC tablet Take 1 tablet (81 mg total) by mouth daily.      Marland Kitchen. atorvastatin (LIPITOR) 80 MG tablet Take 1 tablet once daily  90 tablet  1  . carvedilol (COREG) 12.5 MG tablet TAKE 1 TABLET BY MOUTH TWO TIMES DAILY WITH A MEAL  180 tablet  1  . Cyanocobalamin (VITAMIN B 12 PO) Take 1 tablet by mouth daily.      Marland Kitchen. lisinopril (PRINIVIL,ZESTRIL) 10 MG tablet TAKE 1 TABLET BY MOUTH DAILY  90 tablet  0  . nitroGLYCERIN (NITROSTAT) 0.4 MG SL tablet Place 1 tablet (0.4 mg total) under the tongue every 5 (five) minutes as needed for chest pain.  25 tablet  3  . Omega-3 Fatty Acids (FISH OIL) 1000 MG CPDR Take 1,000 mg by mouth 2 (two) times daily.  60 capsule  6  . ondansetron (ZOFRAN ODT) 4 MG disintegrating tablet Take 1 tablet (4  mg total) by mouth every 8 (eight) hours as needed for nausea or vomiting.  20 tablet  0  . pantoprazole (PROTONIX) 40 MG tablet TAKE 1 TABLET BY MOUTH EVERY DAY  90 tablet  0   No current facility-administered medications on file prior to visit.    No Known Allergies  Past Medical History  Diagnosis Date  . CAD (coronary artery disease)     a. 11/2012 Ant STEMI/Cath/PCI: LM nl, LAD 100p, RI nl, LCX n, RCA nl, EF 40%;  b. After initial trial of Med Rx, pt had further c/p and Ant ST elevation -> Prox LAD stented with 3.0x23 Xience DES, complicated by emoblization down OM3;  c. Recurrent ST elevation and c/p->Cath: patent LAD stent, PTCA of OM3->integrilin x 36 hrs.  . Ischemic cardiomyopathy     a. 11/2012 Echo: EF 30-35%, mild LVH, mid-distal anteroseptal and apical AK, mildly dil LA.  Marland Kitchen. Hypertension   . GERD (gastroesophageal reflux disease)   . Anxiety   . Polysubstance abuse     a. tobacco, marijuana, etoh, Rx narcotics  . NSVT (nonsustained ventricular tachycardia)     a. 11/2012 Lifevest placed @ d/c.    Past Surgical History  Procedure Laterality Date  . None      History  Smoking status  . Current Some Day Smoker -- 0.10 packs/day for 5 years  . Types: Cigarettes  Smokeless tobacco  .  Never Used    Comment: One cigarette per day, unless drinking then a pack/day    History  Alcohol Use  . Yes    Comment: drinks "tequila" on the weekends - 1/5 liquor every week (04/01/14)    Family History  Problem Relation Age of Onset  . Hypertension Mother   . Stroke Mother     Review of Systems: As noted in history of present illness.  All other systems were reviewed and are negative.  Physical Exam: BP 132/86  Pulse 67  Ht 5\' 10"  (1.778 m)  Wt 246 lb 14.4 oz (111.993 kg)  BMI 35.43 kg/m2 Mitchell Stokes is an obese, young white male in no acute distress. HEENT: Normal Neck: No JVD or bruits. No adenopathy or thyromegaly. Lungs: scattered diffuse wheezes Cardiovascular: Regular  rate and rhythm. Normal S1 and S2. No gallop or murmur. Abdomen: Soft and nontender. Obese.Bowel sounds are positive. Extremities: No cyanosis or edema. Pedal pulses are 2+. Skin: Warm and dry Neuro: Alert and oriented x3. Cranial nerves II through XII are intact.  LABORATORY DATA: Ecg today: NSR with normal Ecg. I have personally reviewed and interpreted this study.   Assessment / Plan: 1. Coronary disease status post stenting of the ostium of the LAD in June 2014. Patient is asymptomatic. Given the location of the stent and thrombosis at the time of intervention I would favor long term DAPT. Mitchell Stokes cannot afford Effient so we will switch him to Plavix 75 mg daily.  2. Ischemic cardiomyopathy. This has resolved  with normalization of his LV function by Echo. Continue beta blocker and ACE inhibitor therapy. 3. Hyperlipidemia. Continue high-dose statin therapy. Continue to work on dietary modification and weight loss. Will check fasting lab work today. 4. Hypertension-controlled. 5. History of substance abuse. Patient has resumed Alcohol use. Mitchell Stokes knows this is counterproductive and harmful. Recommend Etoh and smoking (cigarettes and marijuana) cessation. 6. Cough and wheezing. Related to # 1. ACEi may also be playing a role. Will switch lisinopril to Losartan 50 mg daily. Albuterol prn.  I will follow up in 6 months.

## 2014-04-04 ENCOUNTER — Encounter (HOSPITAL_COMMUNITY): Payer: Self-pay | Admitting: Emergency Medicine

## 2014-04-04 ENCOUNTER — Emergency Department (HOSPITAL_COMMUNITY): Payer: Medicaid Other

## 2014-04-04 ENCOUNTER — Emergency Department (HOSPITAL_COMMUNITY)
Admission: EM | Admit: 2014-04-04 | Discharge: 2014-04-04 | Disposition: A | Discharge status: Home / Self Care | Payer: Medicaid Other | Attending: Emergency Medicine | Admitting: Emergency Medicine

## 2014-04-04 DIAGNOSIS — R079 Chest pain, unspecified: Secondary | ICD-10-CM | POA: Diagnosis present

## 2014-04-04 DIAGNOSIS — Z8659 Personal history of other mental and behavioral disorders: Secondary | ICD-10-CM | POA: Diagnosis not present

## 2014-04-04 DIAGNOSIS — R45851 Suicidal ideations: Secondary | ICD-10-CM | POA: Diagnosis not present

## 2014-04-04 DIAGNOSIS — Z720 Tobacco use: Secondary | ICD-10-CM | POA: Diagnosis not present

## 2014-04-04 DIAGNOSIS — I10 Essential (primary) hypertension: Secondary | ICD-10-CM | POA: Diagnosis not present

## 2014-04-04 DIAGNOSIS — R112 Nausea with vomiting, unspecified: Secondary | ICD-10-CM | POA: Diagnosis not present

## 2014-04-04 DIAGNOSIS — I2510 Atherosclerotic heart disease of native coronary artery without angina pectoris: Secondary | ICD-10-CM | POA: Diagnosis not present

## 2014-04-04 DIAGNOSIS — Z7982 Long term (current) use of aspirin: Secondary | ICD-10-CM | POA: Diagnosis not present

## 2014-04-04 DIAGNOSIS — Z79899 Other long term (current) drug therapy: Secondary | ICD-10-CM | POA: Insufficient documentation

## 2014-04-04 DIAGNOSIS — K219 Gastro-esophageal reflux disease without esophagitis: Secondary | ICD-10-CM | POA: Diagnosis not present

## 2014-04-04 DIAGNOSIS — Z7902 Long term (current) use of antithrombotics/antiplatelets: Secondary | ICD-10-CM | POA: Insufficient documentation

## 2014-04-04 LAB — COMPREHENSIVE METABOLIC PANEL
ALT: 35 U/L (ref 0–53)
AST: 20 U/L (ref 0–37)
Albumin: 4 g/dL (ref 3.5–5.2)
Alkaline Phosphatase: 99 U/L (ref 39–117)
Anion gap: 13 (ref 5–15)
BILIRUBIN TOTAL: 0.6 mg/dL (ref 0.3–1.2)
BUN: 19 mg/dL (ref 6–23)
CO2: 23 meq/L (ref 19–32)
Calcium: 9.4 mg/dL (ref 8.4–10.5)
Chloride: 103 mEq/L (ref 96–112)
Creatinine, Ser: 0.91 mg/dL (ref 0.50–1.35)
GFR calc Af Amer: >90 mL/min (ref >90)
GLUCOSE: 107 mg/dL — AB (ref 70–99)
Potassium: 4 mEq/L (ref 3.7–5.3)
SODIUM: 139 meq/L (ref 137–147)
Total Protein: 7.2 g/dL (ref 6.0–8.3)

## 2014-04-04 LAB — CBC WITH DIFFERENTIAL/PLATELET
Basophils Absolute: 0.1 10*3/uL (ref 0.0–0.1)
Basophils Relative: 1 % (ref 0–1)
Eosinophils Absolute: 0.2 10*3/uL (ref 0.0–0.7)
Eosinophils Relative: 2 % (ref 0–5)
HCT: 43.7 % (ref 39.0–52.0)
Hemoglobin: 14.9 g/dL (ref 13.0–17.0)
LYMPHS ABS: 1.7 10*3/uL (ref 0.7–4.0)
LYMPHS PCT: 21 % (ref 12–46)
MCH: 29 pg (ref 26.0–34.0)
MCHC: 34.1 g/dL (ref 30.0–36.0)
MCV: 85 fL (ref 78.0–100.0)
Monocytes Absolute: 0.7 10*3/uL (ref 0.1–1.0)
Monocytes Relative: 9 % (ref 3–12)
Neutro Abs: 5.5 10*3/uL (ref 1.7–7.7)
Neutrophils Relative %: 67 % (ref 43–77)
Platelets: 249 10*3/uL (ref 150–400)
RBC: 5.14 MIL/uL (ref 4.22–5.81)
RDW: 12.5 % (ref 11.5–15.5)
WBC: 8.1 10*3/uL (ref 4.0–10.5)

## 2014-04-04 LAB — I-STAT TROPONIN, ED
TROPONIN I, POC: 0.01 ng/mL (ref 0.00–0.08)
Troponin i, poc: 0.01 ng/mL (ref 0.00–0.08)

## 2014-04-04 LAB — I-STAT CHEM 8, ED
BUN: 19 mg/dL (ref 6–23)
CALCIUM ION: 1.22 mmol/L (ref 1.12–1.23)
CREATININE: 1 mg/dL (ref 0.50–1.35)
Chloride: 105 mEq/L (ref 96–112)
GLUCOSE: 108 mg/dL — AB (ref 70–99)
HCT: 47 % (ref 39.0–52.0)
HEMOGLOBIN: 16 g/dL (ref 13.0–17.0)
POTASSIUM: 3.8 meq/L (ref 3.7–5.3)
Sodium: 140 mEq/L (ref 137–147)
TCO2: 23 mmol/L (ref 0–100)

## 2014-04-04 MED ORDER — PANTOPRAZOLE SODIUM 40 MG PO TBEC: 40.0000 mg | DELAYED_RELEASE_TABLET | Freq: Every day | ORAL | Status: DC

## 2014-04-04 MED ORDER — LISINOPRIL 10 MG PO TABS: 10.0000 mg | ORAL_TABLET | Freq: Every day | ORAL | Status: DC

## 2014-04-04 MED ORDER — ACETAMINOPHEN 325 MG PO TABS: 650.0000 mg | ORAL_TABLET | Freq: Four times a day (QID) | ORAL | Status: DC | PRN

## 2014-04-04 MED ORDER — NITROGLYCERIN 0.4 MG SL SUBL: 0.4000 mg | SUBLINGUAL_TABLET | SUBLINGUAL | Status: DC | PRN

## 2014-04-04 MED ORDER — ASPIRIN EC 81 MG PO TBEC: 81.0000 mg | DELAYED_RELEASE_TABLET | Freq: Every day | ORAL | Status: DC

## 2014-04-04 MED ORDER — SODIUM CHLORIDE 0.9 % IV BOLUS (SEPSIS)
1000.0000 mL | Freq: Once | INTRAVENOUS | Status: AC
Start: 2014-04-04 — End: 2014-04-04
  Administered 2014-04-04: 1000 mL via INTRAVENOUS

## 2014-04-04 MED ORDER — ALBUTEROL SULFATE HFA 108 (90 BASE) MCG/ACT IN AERS: 2.0000 | INHALATION_SPRAY | Freq: Four times a day (QID) | RESPIRATORY_TRACT | Status: DC | PRN

## 2014-04-04 MED ORDER — ATORVASTATIN CALCIUM 80 MG PO TABS: 80.0000 mg | ORAL_TABLET | Freq: Every day | ORAL | Status: DC

## 2014-04-04 MED ORDER — ASPIRIN 81 MG PO CHEW: 324.0000 mg | CHEWABLE_TABLET | Freq: Once | ORAL | Status: DC

## 2014-04-04 MED ORDER — CARVEDILOL 12.5 MG PO TABS: 12.5000 mg | ORAL_TABLET | Freq: Two times a day (BID) | ORAL | Status: DC

## 2014-04-04 MED ORDER — PRASUGREL HCL 10 MG PO TABS: 10.0000 mg | ORAL_TABLET | Freq: Every day | ORAL | Status: DC

## 2014-04-04 MED ORDER — KETOROLAC TROMETHAMINE 30 MG/ML IJ SOLN
15.0000 mg | Freq: Once | INTRAMUSCULAR | Status: AC
  Administered 2014-04-04: 11:00:00 via INTRAVENOUS
  Filled 2014-04-04: qty 1

## 2014-04-04 MED ORDER — NICOTINE 21 MG/24HR TD PT24: 21.0000 mg | MEDICATED_PATCH | Freq: Every day | TRANSDERMAL | Status: DC | PRN

## 2014-04-04 MED ORDER — ONDANSETRON HCL 4 MG PO TABS
4.0000 mg | ORAL_TABLET | Freq: Three times a day (TID) | ORAL | Status: DC | PRN
Start: 2014-04-04 — End: 2014-04-04

## 2014-04-04 NOTE — ED Notes (Signed)
Pt. Stateed, Ive had terrible stress, my children are gone with their mother and my brother is trying to kick me out.  i started having CP today.

## 2014-04-04 NOTE — ED Notes (Signed)
Pt in wine colored scrubs. Belongings at nurses station. Pt agrees with poc

## 2014-04-04 NOTE — ED Provider Notes (Signed)
CSN: [id number]     Arrival date & time 04/04/14  4098 History   First MD Initiated Contact with Patient 04/04/14 0911     Chief Complaint  Patient presents with  . Chest Pain    HPI Comments: 28 y.o. male with a history of CAD, HTN, polysubstance abuse, anxiety, depression. Presents due to 2 days of intermittent chest pain. States he has been under a lot of stress due to the fact that the mother of his children left with the children 2 days ago -- he has had no further contact with them. Regarding his chest pain, he states it began 2 days ago while "walking". Has been intermittent since then. Has not taken any nitro. States this does not feel like when he had an MI.   Patient is a 28 y.o. male presenting with chest pain. The history is provided by the patient.  Chest Pain Pain location:  L chest Pain quality: aching   Pain quality comment:  "Feels like I've been punched" Radiates to: radiates from left upper chest to midline. Pain radiates to the back: no   Pain severity:  Severe Onset quality:  Sudden Duration:  2 days Timing:  Intermittent Progression:  Waxing and waning Chronicity:  New Context: drug use (marijuanna) and movement (and walking)   Relieved by:  None tried Exacerbated by: palpation. Ineffective treatments:  None tried Associated symptoms: diaphoresis, nausea, shortness of breath and vomiting   Associated symptoms: no abdominal pain and no fever   Nausea:    Severity:  Moderate   Onset quality:  Gradual   Duration:  2 days   Timing:  Intermittent   Progression:  Waxing and waning Shortness of breath:    Severity:  Moderate   Onset quality:  Gradual   Duration:  2 days   Timing:  Intermittent   Progression:  Waxing and waning Vomiting:    Quality:  Stomach contents   Severity:  Mild   Duration:  2 days   Timing:  Intermittent   Progression:  Resolved Risk factors: coronary artery disease, hypertension, male sex, obesity and smoking   Risk factors: no  aortic disease, no immobilization and no prior DVT/PE     Past Medical History  Diagnosis Date  . CAD (coronary artery disease)     a. 11/2012 Ant STEMI/Cath/PCI: LM nl, LAD 100p, RI nl, LCX n, RCA nl, EF 40%;  b. After initial trial of Med Rx, pt had further c/p and Ant ST elevation -> Prox LAD stented with 3.0x23 Xience DES, complicated by emoblization down OM3;  c. Recurrent ST elevation and c/p->Cath: patent LAD stent, PTCA of OM3->integrilin x 36 hrs.  . Ischemic cardiomyopathy     a. 11/2012 Echo: EF 30-35%, mild LVH, mid-distal anteroseptal and apical AK, mildly dil LA.  Marland Kitchen Hypertension   . GERD (gastroesophageal reflux disease)   . Anxiety   . Polysubstance abuse     a. tobacco, marijuana, etoh, Rx narcotics  . NSVT (nonsustained ventricular tachycardia)     a. 11/2012 Lifevest placed @ d/c.   Past Surgical History  Procedure Laterality Date  . None     Family History  Problem Relation Age of Onset  . Hypertension Mother   . Stroke Mother    History  Substance Use Topics  . Smoking status: Current Some Day Smoker -- 0.10 packs/day for 5 years    Types: Cigarettes  . Smokeless tobacco: Never Used     Comment: One cigarette per  day, unless drinking then a pack/day  . Alcohol Use: Yes     Comment: drinks "tequila" on the weekends - 1/5 liquor every week (04/01/14)    Review of Systems  Constitutional: Positive for chills and diaphoresis. Negative for fever.  Respiratory: Positive for shortness of breath.   Cardiovascular: Positive for chest pain. Negative for leg swelling.  Gastrointestinal: Positive for nausea and vomiting. Negative for abdominal pain.  Genitourinary: Negative for difficulty urinating.  Musculoskeletal: Negative for gait problem.  Skin: Negative for rash.  Neurological: Negative for seizures and facial asymmetry.  Psychiatric/Behavioral: Positive for suicidal ideas.  All other systems reviewed and are negative.     Allergies  Review of patient's  allergies indicates no known allergies.  Home Medications   Prior to Admission medications   Medication Sig Start Date End Date Taking? Authorizing Provider  albuterol (PROVENTIL HFA;VENTOLIN HFA) 108 (90 BASE) MCG/ACT inhaler Inhale 2 puffs into the lungs every 6 (six) hours as needed for wheezing or shortness of breath. 03/31/14   Peter M SwazilandJordan, MD  aspirin EC 81 MG EC tablet Take 1 tablet (81 mg total) by mouth daily. 11/16/12   Ok Anishristopher R Berge, NP  atorvastatin (LIPITOR) 80 MG tablet Take 1 tablet once daily 12/23/13   Peter M [place]Jordan, MD  carvedilol (COREG) 12.5 MG tablet TAKE 1 TABLET BY MOUTH TWO TIMES DAILY WITH A MEAL    Peter M [place]Jordan, MD  clopidogrel (PLAVIX) 75 MG tablet Take 1 tablet (75 mg total) by mouth daily. 03/31/14   Peter M [place]Jordan, MD  Cyanocobalamin (VITAMIN B 12 PO) Take 1 tablet by mouth daily.    Historical Provider, MD  lisinopril (PRINIVIL,ZESTRIL) 10 MG tablet TAKE 1 TABLET BY MOUTH DAILY 03/16/14   Peter M [place]Jordan, MD  losartan (COZAAR) 50 MG tablet Take 1 tablet (50 mg total) by mouth daily. 03/31/14   Peter M [place]Jordan, MD  nitroGLYCERIN (NITROSTAT) 0.4 MG SL tablet Place 1 tablet (0.4 mg total) under the tongue every 5 (five) minutes as needed for chest pain. 11/16/12   Ok Anishristopher R Berge, NP  Omega-3 Fatty Acids (FISH OIL) 1000 MG CPDR Take 1,000 mg by mouth 2 (two) times daily. 01/23/13   Peter M [place]Jordan, MD  ondansetron (ZOFRAN ODT) 4 MG disintegrating tablet Take 1 tablet (4 mg total) by mouth every 8 (eight) hours as needed for nausea or vomiting. 09/07/13   Pilar Jarvisoug Brtalik, MD  pantoprazole (PROTONIX) 40 MG tablet TAKE 1 TABLET BY MOUTH EVERY DAY 03/16/14   Peter M [place]Jordan, MD   BP 136/81 mmHg  Pulse 82  Temp(Src) 97.6 F (36.4 C) (Oral)  Resp 21  Ht 5\' 10"  (1.778 m)  Wt 246 lb (111.585 kg)  BMI 35.30 kg/m2  SpO2 97%   Physical Exam  Constitutional: He is oriented to person, place, and time. He appears well-developed and well-nourished. No distress.  HENT:   Head: Normocephalic and atraumatic.  Right Ear: External ear normal.  Left Ear: External ear normal.  Nose: Nose normal.  Eyes: Pupils are equal, round, and reactive to light.  Neck: Normal range of motion.  Cardiovascular: Normal rate and regular rhythm.   Pulses:      Radial pulses are 1+ on the right side, and 2+ on the left side.       Dorsalis pedis pulses are 2+ on the right side, and 2+ on the left side.  BPs with no significant difference in both upper extremities  Pulmonary/Chest: Effort normal and breath  sounds normal. No respiratory distress. He has no wheezes. He has no rales. He exhibits tenderness (tender over left costosternal margin).  Abdominal: Soft. He exhibits no distension. There is no tenderness. There is no rebound and no guarding.  Neurological: He is alert and oriented to person, place, and time.  Skin: Skin is warm and dry. No rash noted. He is not diaphoretic.  Psychiatric: He exhibits a depressed mood. He expresses suicidal ideation. He expresses suicidal plans ("car accident or something").  When asked about SI, patient became tearful, quiet; slowly started to answer questions; states he would never kill himself  Vitals reviewed.   ED Course  Procedures  Labs Review  Results for orders placed or performed during the hospital encounter of 04/04/14  CBC with Differential  Result Value Ref Range   WBC 8.1 4.0 - 10.5 K/uL   RBC 5.14 4.22 - 5.81 MIL/uL   Hemoglobin 14.9 13.0 - 17.0 g/dL   HCT 45.443.7 09.839.0 - 11.952.0 %   MCV 85.0 78.0 - 100.0 fL   MCH 29.0 26.0 - 34.0 pg   MCHC 34.1 30.0 - 36.0 g/dL   RDW 14.712.5 82.911.5 - 56.215.5 %   Platelets 249 150 - 400 K/uL   Neutrophils Relative % 67 43 - 77 %   Neutro Abs 5.5 1.7 - 7.7 K/uL   Lymphocytes Relative 21 12 - 46 %   Lymphs Abs 1.7 0.7 - 4.0 K/uL   Monocytes Relative 9 3 - 12 %   Monocytes Absolute 0.7 0.1 - 1.0 K/uL   Eosinophils Relative 2 0 - 5 %   Eosinophils Absolute 0.2 0.0 - 0.7 K/uL   Basophils Relative  1 0 - 1 %   Basophils Absolute 0.1 0.0 - 0.1 K/uL  Comprehensive metabolic panel  Result Value Ref Range   Sodium 139 137 - 147 mEq/L   Potassium 4.0 3.7 - 5.3 mEq/L   Chloride 103 96 - 112 mEq/L   CO2 23 19 - 32 mEq/L   Glucose, Bld 107 (H) 70 - 99 mg/dL   BUN 19 6 - 23 mg/dL   Creatinine, Ser 1.300.91 0.50 - 1.35 mg/dL   Calcium 9.4 8.4 - 86.510.5 mg/dL   Total Protein 7.2 6.0 - 8.3 g/dL   Albumin 4.0 3.5 - 5.2 g/dL   AST 20 0 - 37 U/L   ALT 35 0 - 53 U/L   Alkaline Phosphatase 99 39 - 117 U/L   Total Bilirubin 0.6 0.3 - 1.2 mg/dL   GFR calc non Af Amer >90 >90 mL/min   GFR calc Af Amer >90 >90 mL/min   Anion gap 13 5 - 15  I-Stat Chem 8, ED  Result Value Ref Range   Sodium 140 137 - 147 mEq/L   Potassium 3.8 3.7 - 5.3 mEq/L   Chloride 105 96 - 112 mEq/L   BUN 19 6 - 23 mg/dL   Creatinine, Ser 7.841.00 0.50 - 1.35 mg/dL   Glucose, Bld 696108 (H) 70 - 99 mg/dL   Calcium, Ion 2.951.22 2.841.12 - 1.23 mmol/L   TCO2 23 0 - 100 mmol/L   Hemoglobin 16.0 13.0 - 17.0 g/dL   HCT 13.247.0 44.039.0 - 10.252.0 %  I-stat troponin, ED  Result Value Ref Range   Troponin i, poc 0.01 0.00 - 0.08 ng/mL   Comment 3          I-stat troponin, ED  Result Value Ref Range   Troponin i, poc 0.01 0.00 - 0.08 ng/mL  Comment 3             Imaging Review Dg Chest Portable 1 View  04/04/2014   CLINICAL DATA:  Chest pain.  Shortness of breath.  EXAM: PORTABLE CHEST - 1 VIEW  COMPARISON:  11/07/2012.  FINDINGS: The heart size and mediastinal contours are within normal limits. Mild bibasilar subsegmental atelectasis. The visualized skeletal structures are unremarkable.  IMPRESSION: Mild bibasilar subsegmental atelectasis.   Electronically Signed   By: Maisie Fus  Register   On: 04/04/2014 10:00   EKG #1 Rate 82, PR 134, QTc 427, No ST elevation or depression; appears similar to prior from 03/31/14  EKG #2 Rate 62, NSR, PR 135, QTc 423, No ST elevation or depression; unchanged from earlier  MDM   Final diagnoses:  Chest pain   Suicidal ideations    28 y.o. male with a history of CAD, Ischemic Cardiomyopathy, HTN, Anxiety, Polysubstance Abuse. Presents due to 2 days of aching chest pain that does not feel like his prior MI. Associated with "stress" and passive SI. States he would "never" kill himself because it is a "chump move".   Will obtain delta troponin, delta EKG. No PE risk factors.   On re-evaluation the patient is smiling, laughing at times. Delta troponin negative. EKGs unchanged. Remainder of labs negative. Very atypical for cardiac chest pain given reproducibility of the chest tenderness, patient states that this does not feel like his cardiac chest pain.   Strict return precautions discussed with the patient and given in writing. Resources given for close psychiatric follow up. He assured me he would return to the ED for recurrence of SI, any new or worsening symptoms.   This case managed in conjunction with my attending, Dr. Jodi Mourning.     Maxine Glenn, MD 04/04/14 [phone]

## 2014-04-04 NOTE — Discharge Instructions (Signed)
Chest Pain (Nonspecific) It is often hard to give a diagnosis for the cause of chest pain. There is always a chance that your pain could be related to something serious, such as a heart attack or a blood clot in the lungs. You need to follow up with your doctor. HOME CARE  If antibiotic medicine was given, take it as directed by your doctor. Finish the medicine even if you start to feel better.  For the next few days, avoid activities that bring on chest pain. Continue physical activities as told by your doctor.  Do not use any tobacco products. This includes cigarettes, chewing tobacco, and e-cigarettes.  Avoid drinking alcohol.  Only take medicine as told by your doctor.  Follow your doctor's suggestions for more testing if your chest pain does not go away.  Keep all doctor visits you made. GET HELP IF:  Your chest pain does not go away, even after treatment.  You have a rash with blisters on your chest.  You have a fever. GET HELP RIGHT AWAY IF:   You have more pain or pain that spreads to your arm, neck, jaw, back, or belly (abdomen).  You have shortness of breath.  You cough more than usual or cough up blood.  You have very bad back or belly pain.  You feel sick to your stomach (nauseous) or throw up (vomit).  You have very bad weakness.  You pass out (faint).  You have chills. This is an emergency. Do not wait to see if the problems will go away. Call your local emergency services (911 in U.S.). Do not drive yourself to the hospital. MAKE SURE YOU:   Understand these instructions.  Will watch your condition.  Will get help right away if you are not doing well or get worse. Document Released: 11/07/2007 Document Revised: 05/26/2013 Document Reviewed: 11/07/2007 [facility] Patient Information 2015 [place], [place]. This information is not intended to replace advice given to you by your health care provider. Make sure you discuss any questions you have with your  health care provider.   Suicidal Feelings, How to Help Yourself Everyone feels sad or unhappy at times, but depressing thoughts and feelings of hopelessness can lead to thoughts of suicide. It can seem as if life is too tough to handle. If you feel as though you have reached the point where suicide is the only answer, it is time to let someone know immediately.  HOW TO COPE AND PREVENT SUICIDE  Let family, friends, teachers, or counselors know. Get help. Try not to isolate yourself from those who care about you. Even though you may not feel sociable, talk with someone every day. It is best if it is face-to-face. Remember, they will want to help you.  Eat a regularly spaced and well-balanced diet.  Get plenty of rest.  Avoid alcohol and drugs because they will only make you feel worse and may also lower your inhibitions. Remove them from the home. If you are thinking of taking an overdose of your prescribed medicines, give your medicines to someone who can give them to you one day at a time. If you are on antidepressants, let your caregiver know of your feelings so he or she can provide a safer medicine, if that is a concern.  Remove weapons or poisons from your home.  Try to stick to routines. Follow a schedule and remind yourself that you have to keep that schedule every day.  Set some realistic goals and achieve them.  Make a list and cross things off as you go. Accomplishments give a sense of worth. Wait until you are feeling better before doing things you find difficult or unpleasant to do.  If you are able, try to start exercising. Even half-hour periods of exercise each day will make you feel better. Getting out in the sun or into nature helps you recover from depression faster. If you have a favorite place to walk, take advantage of that.  Increase safe activities that have always given you pleasure. This may include playing your favorite music, reading a good book, painting a picture,  or playing your favorite instrument. Do whatever takes your mind off your depression.  Keep your living space well-lighted. GET HELP Contact a suicide hotline, crisis center, or local suicide prevention center for help right away. Local centers may include a hospital, clinic, community service organization, social service provider, or health department.  Call your local emergency services (911 in the [place]).  Call a suicide hotline:  1-800-273-TALK ((562)835-2065) in the [place].  1-800-SUICIDE 559-680-5753) in the [place].  747-222-9318 in the [place] for Spanish-speaking counselors.  4-696-295-2WUX (437)737-0247) in the [place] for TTY users.  Visit the following websites for information and help:  National Suicide Prevention Lifeline: www.suicidepreventionlifeline.org  Hopeline: www.hopeline.com  [employer] for Suicide Prevention: https://www.ayers.com/  For lesbian, gay, bisexual, transgender, or questioning youth, contact [employer]:  3-664-4-I-HKVQQV 2197621005) in the [place].  www.thetrevorproject.org  In [place], treatment resources are listed in each province with listings available under [employer] for [employer] or similar titles. Another source for Crisis Centres by [place] is located at http://www.suicideprevention.ca/in-crisis-now/find-a-crisis-centre-now/crisis-centres Document Released: 11/25/2002 Document Revised: 08/13/2011 Document Reviewed: 09/15/2013 [facility] Patient Information 2015 [place], [place]. This information is not intended to replace advice given to you by your health care provider. Make sure you discuss any questions you have with your health care provider.      Emergency Department Resource Guide 1) Find a Doctor and Pay Out of Pocket Although you won't have to find out who is covered by your insurance plan, it is a good idea to ask around and get recommendations. You will then  need to call the office and see if the doctor you have chosen will accept you as a new patient and what types of options they offer for patients who are self-pay. Some doctors offer discounts or will set up payment plans for their patients who do not have insurance, but you will need to ask so you aren't surprised when you get to your appointment.  2) Contact Your Local Health Department Not all health departments have doctors that can see patients for sick visits, but many do, so it is worth a call to see if yours does. If you don't know where your local health department is, you can check in your phone book. The CDC also has a tool to help you locate your state's health department, and many state websites also have listings of all of their local health departments.  3) Find a Walk-in Clinic If your illness is not likely to be very severe or complicated, you may want to try a walk in clinic. These are popping up all over the country in pharmacies, drugstores, and shopping centers. They're usually staffed by nurse practitioners or physician assistants that have been trained to treat common illnesses and complaints. They're usually fairly quick and inexpensive. However, if you have serious medical issues or chronic medical problems, these are  probably not your best option.  No Primary Care Doctor: - Call Health Connect at  [phone] - they can help you locate a primary care doctor that  accepts your insurance, provides certain services, etc. - Physician Referral Service- [phone]  Chronic Pain Problems: Organization         Address  Phone   Notes  [facility]  [phone] Patients need to be referred by their primary care doctor.   Medication Assistance: Organization         Address  Phone   Notes  [facility] Medication [facility] [address] [place] [place], [place] [address] [phone] --Must be a resident of [facility] -- Must have NO  insurance coverage whatsoever (no Medicaid/ Medicare, etc.) -- The pt. MUST have a primary care doctor that directs their care regularly and follows them in the community   MedAssist  [phone]   [employer]  [phone]    Agencies that provide inexpensive medical care: Organization         Address  Phone   Notes  [facility]  [phone]   [facility]    [phone]   [facility] [address] [place], [place] [address] [phone]   [facility] [address] [place] [place] [phone]   Planned Parenthood    [phone]   [facility]    [phone]   [facility]  [address], [place] Phone:  [phone], Fax:  [phone] Hours of Operation:  9 am - 6 pm, M-F.  Also accepts Medicaid/Medicare and self-pay.  [facility]  [address], [place] Phone: [phone], Fax: [phone]. Hours of Operation:  8:30 am - 5:30 pm, M-F.  Also accepts Medicaid and self-pay.  [facility] [place] [address] [place] Phone: [phone]   [facility] [address] Natasha Bence [place], [place] [phone], Ext. [phone] Mondays & Thursdays: 7-9 AM.  First 15 patients are seen on a first come, first serve basis.    Medicaid-accepting [facility] Providers:  Organization         Address  Phone   Notes  [facility] [address], [place] [phone] Also accepts self-pay patients.  [facility] [address] Laurell Josephs [place], [place]  [phone]   [facility] [address], [place] [phone]   [facility] [address] [place] [phone]   Renaye Rakers [address], [place]   [phone] Only accepts [place] patients after they have  their name applied to their card.   Self-Pay (no insurance) in [facility]:  Organization         Address  Phone   Notes  Sickle Cell Patients, [facility] [address] [place], [place] [phone]   [facility] [address] [place], [place] [phone]   [facility] [place]  [address], [place] [phone]   [facility]/Dr. Osei-Bonsu  [address] [place] or [address], [place] [phone] Phone number for both [place] and [place] locations is the same.  [facility]  [address], [place] [phone]   [facility] [address], [place] or [address] [phone]   [facility] [address] [phone], phone; [phone], fax Sees patients 1st and 3rd Saturday of every month.  Must not qualify for public or private insurance (i.e. Medicaid, Medicare, [place] Health Choice, Veterans' Benefits)  Household income should be no more than 200% of the poverty level The clinic cannot treat you if you are pregnant or think you are pregnant  Sexually transmitted diseases are not treated at the clinic.    Dental Care: Organization         Address  Phone  Notes  [facility] Department of [facility] [place] [address] [place], [place] [phone] Accepts children up to age 24 who are enrolled in [place] or [place] Health Choice; pregnant women with a Medicaid card; and children who have applied for Medicaid or [place] Health Choice, but were declined, whose parents can pay a reduced fee at time of service.  [facility] of [facility]  [address], [place] [phone] Accepts children up to age 79 who are enrolled in [place] or [place] Health Choice; pregnant women with a Medicaid card; and children who have applied  for Medicaid or [place] Health Choice, but were declined, whose parents can pay a reduced fee at time of service.  [facility]  [address] [place], [place] [phone] Patients are seen by appointment only. Walk-ins are not accepted. [facility] will see patients 27 years of age and older. Monday - Tuesday (8am-5pm) Most Wednesdays (8:30-5pm) $30 per visit, cash only  [facility] Adult Dental Access PROGRAM  [address], [facility] [phone] Patients are seen by appointment only. Walk-ins are not accepted. [facility] will see patients 21 years of age and older. One Wednesday Evening (Monthly: Volunteer Based).  $30 per visit, cash only  [employer]  [phone] for adults; Children under age 15, call Graduate Pediatric Dentistry at [phone]. Children aged 96-14, please call [phone] to request a pediatric application.  Dental services are provided in all areas of dental care including fillings, crowns and bridges, complete and partial dentures, implants, gum treatment, root canals, and extractions. Preventive care is also provided. Treatment is provided to both adults and children. Patients are selected via a lottery and there is often a waiting list.   [facility] [address] [place]  [phone] www.drcivils.com   [facility] [address] [place], [place] [phone], Ext. [phone] Second and Fourth Thursday of each month, opens at 6:30 AM; Clinic ends at 9 AM.  Patients are seen on a first-come first-served basis, and a limited number are seen during each clinic.   [facility]  [address] Ether Griffins [place], [place] [phone]   Eligibility Requirements You must have lived in [place], [place], or [place] counties for at least the last three months.   You cannot be eligible for state or federal sponsored [place], including [employer],  [place], or [employer].   You generally cannot be eligible for healthcare insurance through your employer.    How to apply: Eligibility screenings are held every Tuesday and Wednesday afternoon from 1:00 pm until 4:00 pm. You do not need an appointment for the interview!  [facility]  [address], [place], [place] [phone]   [facility]  [phone]   [facility]  [phone]   [facility]  [phone]    Behavioral Health Resources in the Community: Intensive Outpatient Programs Organization         Address  Phone  Notes  [facility] [address], [place], [place] [phone]   [facility] [address], [place], [place] [phone]   [facility] [address], [place], [place]  [phone]   [facility] [address],  [place], [place] [phone] or [phone]   Substance Abuse Resources Organization         Address  Phone  Notes  [facility]  [phone]   [facility]  [phone]   [facility]  [phone]   [facility]  [phone]   Residential & Outpatient Substance Abuse Program  [phone]   Psychological Services Organization         Address  Phone  Notes  [facility]  [phone]- [phone]   [facility]  [phone]   [facility] [address], [place] [phone] or [phone]    Mobile Crisis Teams Organization         Address  Phone  Notes  [facility], Mobile Crisis Care Unit  [phone]   [facility]  [address] [place], [place] [phone]   Doristine LocksSharon DeEsch [address] [place] [phone]    Self-Help/Support Groups Organization         Address  Phone             Notes  [facility] [place] - variety of support  groups  336- [phone] Call for more information  Narcotics Anonymous (NA), [facility] [address], [place]  2 meetings at this location   Statisticianesidential Treatment Programs Organization         Address  Phone  Notes  [facility] [address],    [place]  [phone]   [facility]  [address], [place] [phone], [place], [place] [phone]   [facility] [address], [place] [phone] Admissions: 8am-3pm M-F  [facility] [address]    [place], [place] [phone]   [facility] [address], [place], [place] [phone]   [facility] [address]  [place], [place] [phone]   [facility] - Intensive Outpatient [address], [place], [place] [phone]   [facility] ([facility] [address]  [place], [place] [phone] or [phone]   [facility] ([facility]) [address] [place], [place] [phone] Accepts Medicaid  [facility] [address]  [place] [phone] Substance Abuse/Addiction Treatment   [facility] Resources Organization         Address  Phone  Notes  [facility]  [phone]   Angie FavaJulie Brannon, PhD [address], [address] [place], [place]   [phone](336) [phone] or [phone]   [facility]   [address] [place], [place] [phone]   [facility] [address], [place], [place] [phone] Insurance/Medicaid/sponsorship through [employer] [facility]  [facility] [address]  [place], [place] [phone] Therapy/tele-psych/case  [facility] [address]   [place], [place] [phone](336) [phone]    Dr. Lolly MustacheArfeen  [phone]   [facility] [place]  [facility] 1) [address], [place] 2) [address], [place] 3)   [address], [place] [phone](336) [phone](336) [phone]  [phone]   [facility] Child Abuse Hotline [phone](336) [phone] or [phone]) [phone] (After Hours)

## 2014-04-04 NOTE — ED Notes (Signed)
Pt states he is no longer suicidal and would like to go home.  MD notified.

## 2014-05-11 ENCOUNTER — Other Ambulatory Visit: Payer: Self-pay

## 2014-05-11 MED ORDER — PANTOPRAZOLE SODIUM 40 MG PO TBEC: 40.0000 mg | DELAYED_RELEASE_TABLET | Freq: Every day | ORAL | Status: DC

## 2014-05-13 ENCOUNTER — Encounter (HOSPITAL_COMMUNITY): Payer: Self-pay | Admitting: Cardiology

## 2015-02-17 ENCOUNTER — Emergency Department (HOSPITAL_COMMUNITY)
Admission: EM | Admit: 2015-02-17 | Discharge: 2015-02-17 | Disposition: A | Discharge status: Home / Self Care | Payer: Medicaid Other | Attending: Emergency Medicine | Admitting: Emergency Medicine

## 2015-02-17 ENCOUNTER — Emergency Department (HOSPITAL_COMMUNITY): Payer: Medicaid Other

## 2015-02-17 ENCOUNTER — Encounter (HOSPITAL_COMMUNITY): Payer: Self-pay

## 2015-02-17 DIAGNOSIS — Z8659 Personal history of other mental and behavioral disorders: Secondary | ICD-10-CM | POA: Insufficient documentation

## 2015-02-17 DIAGNOSIS — X58XXXA Exposure to other specified factors, initial encounter: Secondary | ICD-10-CM | POA: Insufficient documentation

## 2015-02-17 DIAGNOSIS — Z720 Tobacco use: Secondary | ICD-10-CM | POA: Insufficient documentation

## 2015-02-17 DIAGNOSIS — I2510 Atherosclerotic heart disease of native coronary artery without angina pectoris: Secondary | ICD-10-CM | POA: Insufficient documentation

## 2015-02-17 DIAGNOSIS — Y9289 Other specified places as the place of occurrence of the external cause: Secondary | ICD-10-CM | POA: Insufficient documentation

## 2015-02-17 DIAGNOSIS — Z7982 Long term (current) use of aspirin: Secondary | ICD-10-CM | POA: Insufficient documentation

## 2015-02-17 DIAGNOSIS — Y998 Other external cause status: Secondary | ICD-10-CM | POA: Insufficient documentation

## 2015-02-17 DIAGNOSIS — I10 Essential (primary) hypertension: Secondary | ICD-10-CM | POA: Insufficient documentation

## 2015-02-17 DIAGNOSIS — Y9389 Activity, other specified: Secondary | ICD-10-CM | POA: Insufficient documentation

## 2015-02-17 DIAGNOSIS — Z79899 Other long term (current) drug therapy: Secondary | ICD-10-CM | POA: Insufficient documentation

## 2015-02-17 DIAGNOSIS — S39012A Strain of muscle, fascia and tendon of lower back, initial encounter: Secondary | ICD-10-CM | POA: Insufficient documentation

## 2015-02-17 DIAGNOSIS — K219 Gastro-esophageal reflux disease without esophagitis: Secondary | ICD-10-CM | POA: Insufficient documentation

## 2015-02-17 DIAGNOSIS — Z9889 Other specified postprocedural states: Secondary | ICD-10-CM | POA: Insufficient documentation

## 2015-02-17 DIAGNOSIS — Z7902 Long term (current) use of antithrombotics/antiplatelets: Secondary | ICD-10-CM | POA: Insufficient documentation

## 2015-02-17 MED ORDER — HYDROCODONE-ACETAMINOPHEN 5-325 MG PO TABS
2.0000 | ORAL_TABLET | Freq: Once | ORAL | Status: AC
Start: 2015-02-17 — End: 2015-02-17
  Administered 2015-02-17: 2 via ORAL
  Filled 2015-02-17: qty 2

## 2015-02-17 MED ORDER — METHOCARBAMOL 500 MG PO TABS
500.0000 mg | ORAL_TABLET | Freq: Once | ORAL | Status: AC
  Administered 2015-02-17: 500 mg via ORAL
  Filled 2015-02-17: qty 1

## 2015-02-17 MED ORDER — METHOCARBAMOL 500 MG PO TABS: 1000.0000 mg | ORAL_TABLET | Freq: Four times a day (QID) | ORAL | Status: AC

## 2015-02-17 MED ORDER — HYDROCODONE-ACETAMINOPHEN 5-325 MG PO TABS: 1.0000 | ORAL_TABLET | ORAL | Status: DC | PRN

## 2015-02-17 MED ORDER — HYDROCODONE-ACETAMINOPHEN 5-325 MG PO TABS
1.0000 | ORAL_TABLET | Freq: Once | ORAL | Status: DC
Start: 2015-02-17 — End: 2015-02-17

## 2015-02-17 NOTE — Discharge Instructions (Signed)
Lumbosacral Strain Lumbosacral strain is a strain of any of the parts that make up your lumbosacral vertebrae. Your lumbosacral vertebrae are the bones that make up the lower third of your backbone. Your lumbosacral vertebrae are held together by muscles and tough, fibrous tissue (ligaments).  CAUSES  A sudden blow to your back can cause lumbosacral strain. Also, anything that causes an excessive stretch of the muscles in the low back can cause this strain. This is typically seen when people exert themselves strenuously, fall, lift heavy objects, bend, or crouch repeatedly. RISK FACTORS  Physically demanding work.  Participation in pushing or pulling sports or sports that require a sudden twist of the back (tennis, golf, baseball).  Weight lifting.  Excessive lower back curvature.  Forward-tilted pelvis.  Weak back or abdominal muscles or both.  Tight hamstrings. SIGNS AND SYMPTOMS  Lumbosacral strain may cause pain in the area of your injury or pain that moves (radiates) down your leg.  DIAGNOSIS Your health care provider can often diagnose lumbosacral strain through a physical exam. In some cases, you may need tests such as X-ray exams.  TREATMENT  Treatment for your lower back injury depends on many factors that your clinician will have to evaluate. However, most treatment will include the use of anti-inflammatory medicines. HOME CARE INSTRUCTIONS   Avoid hard physical activities (tennis, racquetball, waterskiing) if you are not in proper physical condition for it. This may aggravate or create problems.  If you have a back problem, avoid sports requiring sudden body movements. Swimming and walking are generally safer activities.  Maintain good posture.  Maintain a healthy weight.  For acute conditions, you may put ice on the injured area.  Put ice in a plastic bag.  Place a towel between your skin and the bag.  Leave the ice on for 20 minutes, 2-3 times a day.  When the  low back starts healing, stretching and strengthening exercises may be recommended. SEEK MEDICAL CARE IF:  Your back pain is getting worse.  You experience severe back pain not relieved with medicines. SEEK IMMEDIATE MEDICAL CARE IF:   You have numbness, tingling, weakness, or problems with the use of your arms or legs.  There is a change in bowel or bladder control.  You have increasing pain in any area of the body, including your belly (abdomen).  You notice shortness of breath, dizziness, or feel faint.  You feel sick to your stomach (nauseous), are throwing up (vomiting), or become sweaty.  You notice discoloration of your toes or legs, or your feet get very cold. MAKE SURE YOU:   Understand these instructions.  Will watch your condition.  Will get help right away if you are not doing well or get worse. Document Released: 02/28/2005 Document Revised: 05/26/2013 Document Reviewed: 01/07/2013 [facility] Patient Information 2015 [place], [place]. This information is not intended to replace advice given to you by your health care provider. Make sure you discuss any questions you have with your health care provider.    Use the medications as directed.  Do not drive within 4 hours of taking hydrocodone as this will make you drowsy.  Avoid lifting,  Bending,  Twisting or any other activity that worsens your pain over the next week.  Apply an  icepack  to your lower back for 10-15 minutes every 2 hours for the next 2 days.  You may add a heating pad 20 minutes 3 times daily starting on Sunday as discussed. You should get rechecked if  your symptoms are not better over the next 5 days, or you develop increased pain,  Weakness in your leg(s) or loss of bladder or bowel function - these are symptoms of a worsening injury.

## 2015-02-17 NOTE — ED Notes (Signed)
Pt reports he was picking up a board when he felt his back pop. States he needs help walking.

## 2015-02-18 NOTE — ED Provider Notes (Signed)
CSN: [id number]     Arrival date & time 02/17/15  1248 History   First MD Initiated Contact with Patient 02/17/15 1326     Chief Complaint  Patient presents with  . Back Pain     (Consider location/radiation/quality/duration/timing/severity/associated sxs/prior Treatment) The history is provided by the patient.   Mitchell Stokes is a 29 y.o. male with a past medical history significant for hypertension, CAD and ischemic cardiomyopathy, also with a history of polysubstance abuse, which he denies being a current problem presenting with low back pain which started suddenly when he felt a popping sensation in his lower back when lifting a heavy board at home during a home improvement project.  Pain is worsened with movement and palpation.  He is able to ambulate but with the exquisite pain.  He denies weakness or numbness in his extremities and there is no radiation of pain.  He denies bladder or bowel incontinence or retention.  He denies IV drug use, no history of cancer.     Regarding pain pill use, he states he has not had any narcotic pain medications since last December.  He does have them available to him, stating his girlfriend has Percocet, but he does not take her medication even when offered this a.m. by her since his pain was so bad.  He denies that he has an ongoing narcotic problem.     Past Medical History  Diagnosis Date  . CAD (coronary artery disease)     a. 11/2012 Ant STEMI/Cath/PCI: LM nl, LAD 100p, RI nl, LCX n, RCA nl, EF 40%;  b. After initial trial of Med Rx, pt had further c/p and Ant ST elevation -> Prox LAD stented with 3.0x23 Xience DES, complicated by emoblization down OM3;  c. Recurrent ST elevation and c/p->Cath: patent LAD stent, PTCA of OM3->integrilin x 36 hrs.  . Ischemic cardiomyopathy     a. 11/2012 Echo: EF 30-35%, mild LVH, mid-distal anteroseptal and apical AK, mildly dil LA.  Marland Kitchen Hypertension   . GERD (gastroesophageal reflux disease)   . Anxiety   .  Polysubstance abuse     a. tobacco, marijuana, etoh, Rx narcotics  . NSVT (nonsustained ventricular tachycardia)     a. 11/2012 Lifevest placed @ d/c.   Past Surgical History  Procedure Laterality Date  . None    . Left heart catheterization with coronary angiogram N/A 11/10/2012    Procedure: LEFT HEART CATHETERIZATION WITH CORONARY ANGIOGRAM;  Surgeon: Peter M Swaziland, MD;  Location: [facility] CATH LAB;  Service: Cardiovascular;  Laterality: N/A;  . Percutaneous coronary stent intervention (pci-s) N/A 11/12/2012    Procedure: PERCUTANEOUS CORONARY STENT INTERVENTION (PCI-S);  Surgeon: Iran Ouch, MD;  Location: [facility] CATH LAB;  Service: Cardiovascular;  Laterality: N/A;  . Left heart catheterization with coronary angiogram N/A 11/12/2012    Procedure: LEFT HEART CATHETERIZATION WITH CORONARY ANGIOGRAM;  Surgeon: Lennette Bihari, MD;  Location[facility] CATH LAB;  Service: Cardiovascular;  Laterality: N/A;   Family History  Problem Relation Age of Onset  . Hypertension Mother   . Stroke Mother    Social History  Substance Use Topics  . Smoking status: Current Some Day Smoker -- 0.10 packs/day for 5 years    Types: Cigarettes  . Smokeless tobacco: Never Used     Comment: One cigarette per day, unless drinking then a pack/day  . Alcohol Use: Yes     Comment: drinks "tequila" on the weekends - 1/5 liquor every week (04/01/14)  Review of Systems  Constitutional: Negative for fever.  Respiratory: Negative for shortness of breath.   Cardiovascular: Negative for chest pain and leg swelling.  Gastrointestinal: Negative for abdominal pain, constipation and abdominal distention.  Genitourinary: Negative for dysuria, urgency, frequency, flank pain and difficulty urinating.  Musculoskeletal: Positive for back pain. Negative for joint swelling and gait problem.  Skin: Negative for rash.  Neurological: Negative for weakness and numbness.      Allergies  Review of patient's allergies indicates no  known allergies.  Home Medications   Prior to Admission medications   Medication Sig Start Date End Date Taking? Authorizing Provider  aspirin EC 81 MG EC tablet Take 1 tablet (81 mg total) by mouth daily. 11/16/12  Yes Ok Anis, NP  Cyanocobalamin (VITAMIN B 12 PO) Take 1 tablet by mouth daily.   Yes Historical Provider, MD  Omega-3 Fatty Acids (FISH OIL) 1000 MG CPDR Take 1,000 mg by mouth 2 (two) times daily. 01/23/13  Yes Peter M Swaziland, MD  albuterol (PROVENTIL HFA;VENTOLIN HFA) 108 (90 BASE) MCG/ACT inhaler Inhale 2 puffs into the lungs every 6 (six) hours as needed for wheezing or shortness of breath. 03/31/14   Peter M Swaziland, MD  atorvastatin (LIPITOR) 80 MG tablet Take 1 tablet once daily 12/23/13   Peter M Swaziland, MD  carvedilol (COREG) 12.5 MG tablet TAKE 1 TABLET BY MOUTH TWO TIMES DAILY WITH A MEAL    Peter M Swaziland, MD  clopidogrel (PLAVIX) 75 MG tablet Take 1 tablet (75 mg total) by mouth daily. 03/31/14   Peter M Swaziland, MD  HYDROcodone-acetaminophen (NORCO/VICODIN) 5-325 MG per tablet Take 1 tablet by mouth every 4 (four) hours as needed. 02/17/15   Burgess Amor, PA-C  lisinopril (PRINIVIL,ZESTRIL) 10 MG tablet TAKE 1 TABLET BY MOUTH DAILY 03/16/14   Peter M Swaziland, MD  losartan (COZAAR) 50 MG tablet Take 1 tablet (50 mg total) by mouth daily. 03/31/14   Peter M Swaziland, MD  methocarbamol (ROBAXIN) 500 MG tablet Take 2 tablets (1,000 mg total) by mouth 4 (four) times daily. 02/17/15 02/27/15  Burgess Amor, PA-C  nitroGLYCERIN (NITROSTAT) 0.4 MG SL tablet Place 1 tablet (0.4 mg total) under the tongue every 5 (five) minutes as needed for chest pain. 11/16/12   Ok Anis, NP  ondansetron (ZOFRAN ODT) 4 MG disintegrating tablet Take 1 tablet (4 mg total) by mouth every 8 (eight) hours as needed for nausea or vomiting. Patient not taking: Reported on 02/17/2015 09/07/13   Pilar Jarvis, MD  pantoprazole (PROTONIX) 40 MG tablet Take 1 tablet (40 mg total) by mouth  daily. Patient not taking: Reported on 02/17/2015 05/11/14   Peter M Swaziland, MD  prasugrel (EFFIENT) 10 MG TABS tablet Take 10 mg by mouth daily.    Historical Provider, MD  tetrahydrozoline-zinc (VISINE-AC) 0.05-0.25 % ophthalmic solution Place 1 drop into both eyes 3 (three) times daily as needed (for dry eyes).    Historical Provider, MD   BP 120/76 mmHg  Pulse 70  Temp(Src) 97.8 F (36.6 C) (Oral)  Resp 16  Ht 5\' 7"  (1.702 m)  Wt 240 lb (108.863 kg)  BMI 37.58 kg/m2  SpO2 100% Physical Exam  Constitutional: He appears well-developed and well-nourished.  HENT:  Head: Normocephalic.  Eyes: Conjunctivae are normal.  Neck: Normal range of motion. Neck supple.  Cardiovascular: Normal rate and intact distal pulses.   Pedal pulses normal.  Pulmonary/Chest: Effort normal.  Abdominal: Soft. Bowel sounds are normal. He exhibits no distension  and no mass.  Musculoskeletal: Normal range of motion. He exhibits no edema.       Lumbar back: He exhibits tenderness and bony tenderness. He exhibits no swelling, no edema, no deformity and no spasm.  Tender to palpation mid lumbar at L4 region with bilateral soft tissue tenderness.  There is no edema.  Neurological: He is alert. He has normal strength. He displays no atrophy and no tremor. No sensory deficit. Gait normal.  Reflex Scores:      Patellar reflexes are 2+ on the right side and 2+ on the left side.      Achilles reflexes are 2+ on the right side and 2+ on the left side. No strength deficit noted in hip and knee flexor and extensor muscle groups.  Ankle flexion and extension intact.  Skin: Skin is warm and dry.  Psychiatric: He has a normal mood and affect.  Nursing note and vitals reviewed.   ED Course  Procedures (including critical care time) Labs Review Labs Reviewed - No data to display  Imaging Review Dg Lumbar Spine Complete  02/17/2015   CLINICAL DATA:  Patient with lower back pain since hitting dropped board yesterday.  Initial encounter.  EXAM: LUMBAR SPINE - COMPLETE 4+ VIEW  COMPARISON:  None.  FINDINGS: There is no evidence of lumbar spine fracture. Alignment is normal. Intervertebral disc spaces are maintained.  IMPRESSION: Negative.   Electronically Signed   By: Annia Belt M.D.   On: 02/17/2015 13:44   I have personally reviewed and evaluated these images and lab results as part of my medical decision-making.   EKG Interpretation None      MDM   Final diagnoses:  Lumbar strain, initial encounter  patient appears fairly comfortable at rest, but in obvious discomfort when he attempts to sit up, stand or walk.  Patient becomes slightly diaphoretic with these activities.  He he does have obvious low back acute pain, I do not feel he is drug-seeking.  X-rays reviewed.  Negative lumbar plain film.  He has no neurologic deficits on today's exam.  There is no indication for further imaging such as MRI.  He was prescribed hydrocodone and Robaxin.  Advised heat therapy 20 minutes 3-4 times daily starting in 2-3 days, but to use ice only for the next 2 days.  He was given referral to [facility] for obtaining primary care.  In the interim advised to return here for worsened symptoms including weakness in either lower extremity or for bowel or bladder incontinence or retention.    Burgess Amor, PA-C 02/18/15 1855  Glynn Octave, MD 02/19/15 [phone]

## 2015-02-25 ENCOUNTER — Telehealth: Payer: Self-pay | Admitting: Cardiology

## 2015-02-25 NOTE — Telephone Encounter (Signed)
This message was left on my voicemail at lunch: He wants to know if he had a blood clot or blocked artery,he had a stent put in. Needs to know what can he eat or drink with a blood clot,if that is what he has.

## 2015-02-25 NOTE — Telephone Encounter (Signed)
Information about medical history discussed, pt concerned for alcohol "thinning the blood" and putting him at risk for clot. Advised limitation of alcoholic drinks as recommended in the past, remaining on medications prescribed, no further indications. Pt voiced understanding.

## 2016-04-27 IMAGING — DX DG LUMBAR SPINE COMPLETE 4+V
5 series · 5 of 5 positions shown · non-contrast
Comparison: None.

CLINICAL DATA: Patient with lower back pain since hitting dropped
board yesterday. Initial encounter.

EXAM:
LUMBAR SPINE - COMPLETE 4+ VIEW

[l-spine ap]
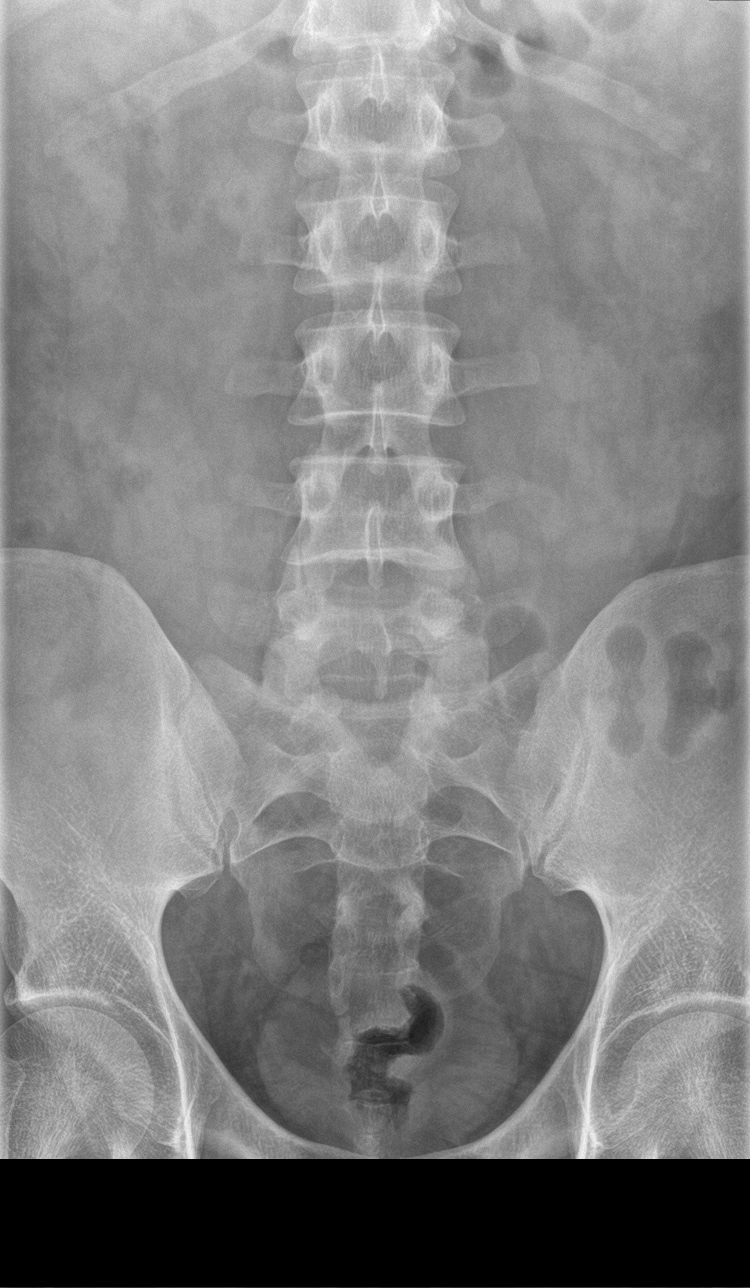

[l-spine obl (1 of 2)]
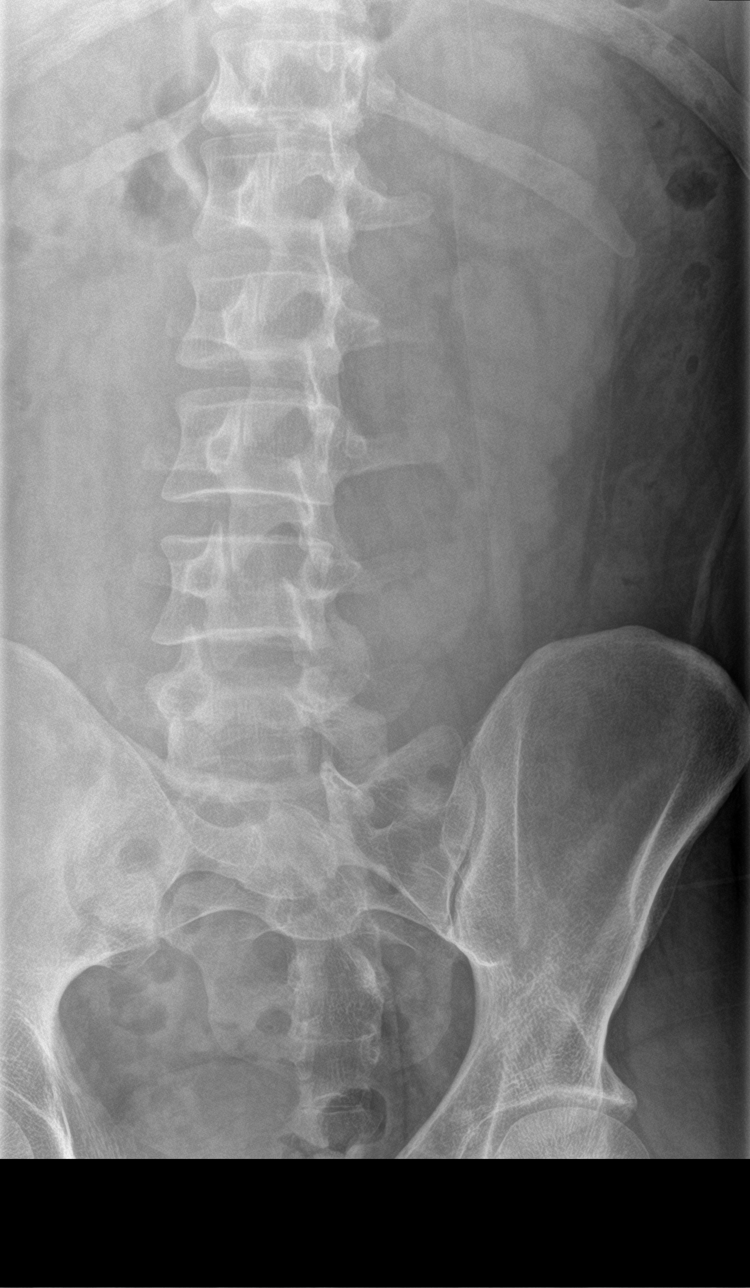

[l-spine obl (2 of 2)]
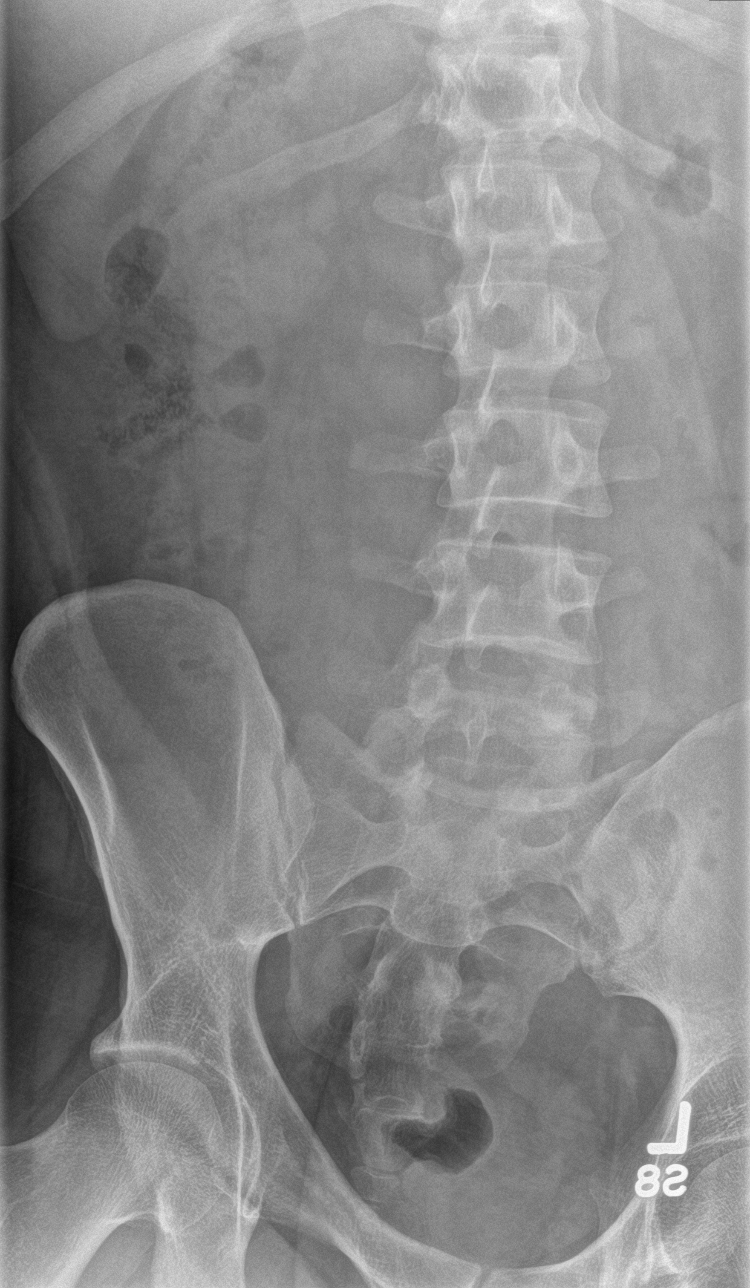

[l-spine lat]
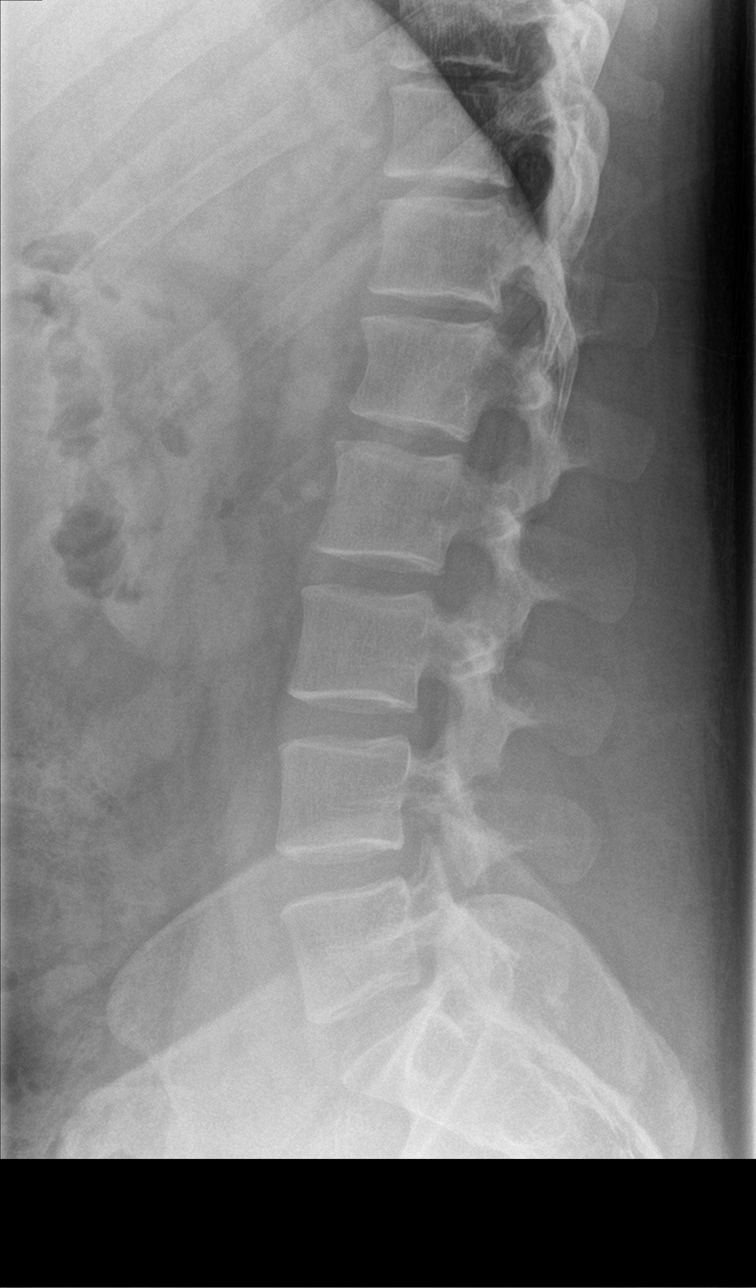

[l-spine spot]
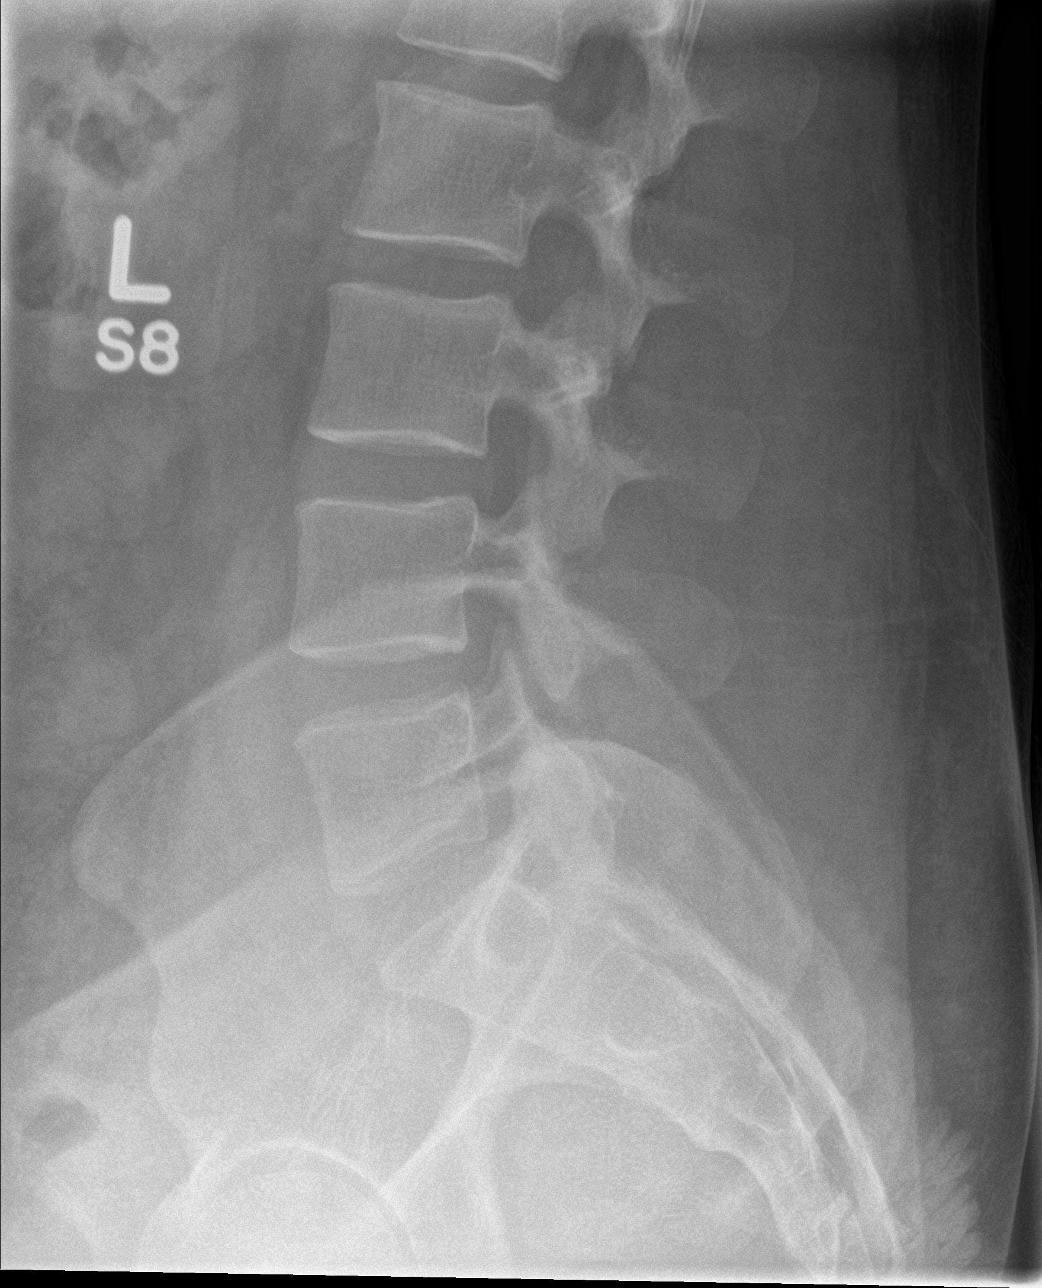

[5 of 5 positions shown; findings below may reference images not displayed]

FINDINGS: There is no evidence of lumbar spine fracture. Alignment is normal.
Intervertebral disc spaces are maintained.
IMPRESSION: Negative.

## 2016-12-20 NOTE — Progress Notes (Signed)
Mitchell MolaAlexander L Stokes Date of Birth: 1986/01/29 Medical Record #[id number]  History of Present Illness: Mitchell Hollingsheadlexander is seen for followup CAD. He was last seen in October 2015. He was admitted in June of 2014 with a non-STEMI. Cardiac catheterization demonstrated occlusion of the ostium of the LAD with right-to-left collaterals. Ejection fraction was reduced at 40% by cath. Echocardiogram showed an ejection fraction of 30-35%. He was initially treated medically but because of recurrent chest pain and ST elevation he underwent stenting of the ostium of the LAD with a drug-eluting stent. This was complicated by distal embolization to the third obtuse marginal vessel. PTCA was performed with modest improvement but continued distal occlusion of the OM 3. Subsequent Echo in October 2014 showed LV function had returned to normal. He has a history of HTN, HLD, and polysubstance abuse (Etoh, tobacco, marijuana, narcotics).   On follow up today he states he hasn't followed up because he lost his Medicaid and was unable to afford his visit or medication. Now remarried and has custody of his kids. 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 As part of his custody of his kids he has to stay sober around kids so now only has Etoh once a month. Smokes 5 cigarettes and 2 joints daily.    He has only rare symptoms of chest tightness that are random. Main activity is at work. He does have acid reflux symptoms.   Current Outpatient Prescriptions on File Prior to Visit  Medication Sig Dispense Refill  . albuterol (PROVENTIL HFA;VENTOLIN HFA) 108 (90 BASE) MCG/ACT inhaler Inhale 2 puffs into the lungs every 6 (six) hours as needed for wheezing or shortness of breath. 1 Inhaler 2  . aspirin EC 81 MG EC tablet Take 1 tablet (81 mg total) by mouth daily.    . nitroGLYCERIN (NITROSTAT) 0.4 MG SL tablet Place 1 tablet (0.4 mg total) under the tongue every 5 (five) minutes as  needed for chest pain. 25 tablet 3  . Omega-3 Fatty Acids (FISH OIL) 1000 MG CPDR Take 1,000 mg by mouth 2 (two) times daily. 60 capsule 6   No current facility-administered medications on file prior to visit.     No Known Allergies  Past Medical History:  Diagnosis Date  . Anxiety   . CAD (coronary artery disease)    a. 11/2012 Ant STEMI/Cath/PCI: LM nl, LAD 100p, RI nl, LCX n, RCA nl, EF 40%;  b. After initial trial of Med Rx, pt had further c/p and Ant ST elevation -> Prox LAD stented with 3.0x23 Xience DES, complicated by emoblization down OM3;  c. Recurrent ST elevation and c/p->Cath: patent LAD stent, PTCA of OM3->integrilin x 36 hrs.  Marland Kitchen. GERD (gastroesophageal reflux disease)   . Hypertension   . Ischemic cardiomyopathy    a. 11/2012 Echo: EF 30-35%, mild LVH, mid-distal anteroseptal and apical AK, mildly dil LA.  Marland Kitchen. NSVT (nonsustained ventricular tachycardia) (HCC)    a. 11/2012 Lifevest placed @ d/c.  Marland Kitchen. Polysubstance abuse    a. tobacco, marijuana, etoh, Rx narcotics    Past Surgical History:  Procedure Laterality Date  . LEFT HEART CATHETERIZATION WITH CORONARY ANGIOGRAM N/A 11/10/2012   Procedure: LEFT HEART CATHETERIZATION WITH CORONARY ANGIOGRAM;  Surgeon: Conway Fedora M SwazilandJordan, MD;  [facility];  Service: Cardiovascular;  Laterality: N/A;  . LEFT HEART CATHETERIZATION WITH CORONARY ANGIOGRAM N/A 11/12/2012   Procedure: LEFT HEART CATHETERIZATION WITH CORONARY ANGIOGRAM;  Surgeon: Clovis Puhomas A  Tresa Endo, MD;  Location: [facility] CATH LAB;  Service: Cardiovascular;  Laterality: N/A;  . None    . PERCUTANEOUS CORONARY STENT INTERVENTION (PCI-S) N/A 11/12/2012   Procedure: PERCUTANEOUS CORONARY STENT INTERVENTION (PCI-S);  Surgeon: Iran Ouch, MD;  Location: [facility] CATH LAB;  Service: Cardiovascular;  Laterality: N/A;    History  Smoking Status  . Current Some Day Smoker  . Packs/day: 0.10  . Years: 5.00  . Types: Cigarettes  Smokeless Tobacco  . Never Used    Comment: One cigarette  per day, unless drinking then a pack/day    History  Alcohol Use  . Yes    Comment: drinks "tequila" on the weekends - 1/5 liquor every week (04/01/14)    Family History  Problem Relation Age of Onset  . Hypertension Mother   . Stroke Mother     Review of Systems: As noted in history of present illness.  All other systems were reviewed and are negative.  Physical Exam: BP 134/90   Pulse 62   Ht 5\' 7"  (1.702 m)   Wt 230 lb 9.6 oz (104.6 kg)   BMI 36.12 kg/m  He is an obese, young white male in no acute distress. HEENT: Normal Neck: No JVD or bruits. No adenopathy or thyromegaly. Lungs: scattered diffuse wheezes Cardiovascular: Regular rate and rhythm. Normal S1 and S2. No gallop or murmur. Abdomen: Soft and nontender. Obese.Bowel sounds are positive. Extremities: No cyanosis or edema. Pedal pulses are 2+. Skin: Warm and dry Neuro: Alert and oriented x3. Cranial nerves II through XII are intact.  LABORATORY DATA: Ecg today: NSR with normal Ecg. I have personally reviewed and interpreted this study.   Assessment / Plan: 1. Coronary disease status post stenting of the ostium of the LAD in June 2014. Patient is minimally symptomatic. I have recommended a follow up regular ETT.  2. Ischemic cardiomyopathy. This has resolved  with normalization of his LV function by Echo. 3. Hyperlipidemia. Will resume high-dose statin therapy with lipitor 80 mg daily. Work on dietary modification and weight loss. Will check fasting lab work today and repeat after on medication for 3 months.  4. Hypertension-elevated- will start lisinopril 10 mg daily. 5. History of substance abuse. Continue Etoh abstinence. Recommend complete smoking cessation.    I will follow up in 3 months.

## 2016-12-26 ENCOUNTER — Other Ambulatory Visit: Payer: Self-pay

## 2016-12-26 ENCOUNTER — Encounter: Payer: Self-pay | Admitting: Cardiology

## 2016-12-26 ENCOUNTER — Ambulatory Visit (INDEPENDENT_AMBULATORY_CARE_PROVIDER_SITE_OTHER): Payer: Medicaid Other | Admitting: Cardiology

## 2016-12-26 VITALS — BP 134/90 | HR 62 | Ht 67.0 in | Wt 230.6 lb

## 2016-12-26 DIAGNOSIS — I10 Essential (primary) hypertension: Secondary | ICD-10-CM | POA: Diagnosis not present

## 2016-12-26 DIAGNOSIS — E782 Mixed hyperlipidemia: Secondary | ICD-10-CM | POA: Diagnosis not present

## 2016-12-26 DIAGNOSIS — I2510 Atherosclerotic heart disease of native coronary artery without angina pectoris: Secondary | ICD-10-CM

## 2016-12-26 LAB — CBC WITH DIFFERENTIAL/PLATELET
Basophils Absolute: 0 10*3/uL (ref 0.0–0.2)
Basos: 1 %
EOS (ABSOLUTE): 0.1 10*3/uL (ref 0.0–0.4)
Eos: 2 %
Hematocrit: 44.5 % (ref 37.5–51.0)
Hemoglobin: 15.6 g/dL (ref 13.0–17.7)
IMMATURE GRANULOCYTES: 0 %
Immature Grans (Abs): 0 10*3/uL (ref 0.0–0.1)
Lymphocytes Absolute: 1.8 10*3/uL (ref 0.7–3.1)
Lymphs: 30 %
MCH: 29.3 pg (ref 26.6–33.0)
MCHC: 35.1 g/dL (ref 31.5–35.7)
MCV: 84 fL (ref 79–97)
Monocytes Absolute: 0.5 10*3/uL (ref 0.1–0.9)
Monocytes: 9 %
NEUTROS PCT: 58 %
Neutrophils Absolute: 3.6 10*3/uL (ref 1.4–7.0)
PLATELETS: 243 10*3/uL (ref 150–379)
RBC: 5.33 x10E6/uL (ref 4.14–5.80)
RDW: 13.1 % (ref 12.3–15.4)
WBC: 6.1 10*3/uL (ref 3.4–10.8)

## 2016-12-26 LAB — LIPID PANEL
Chol/HDL Ratio: 5.4 ratio — ABNORMAL HIGH (ref 0.0–5.0)
Cholesterol, Total: 207 mg/dL — ABNORMAL HIGH (ref 100–199)
HDL: 38 mg/dL — ABNORMAL LOW (ref >39)
LDL Calculated: 144 mg/dL — ABNORMAL HIGH (ref 0–99)
Triglycerides: 127 mg/dL (ref 0–149)
VLDL Cholesterol Cal: 25 mg/dL (ref 5–40)

## 2016-12-26 LAB — HEPATIC FUNCTION PANEL
ALK PHOS: 79 IU/L (ref 39–117)
ALT: 20 IU/L (ref 0–44)
AST: 17 IU/L (ref 0–40)
Albumin: 4.5 g/dL (ref 3.5–5.5)
BILIRUBIN, DIRECT: 0.11 mg/dL (ref 0.00–0.40)
Bilirubin Total: 0.4 mg/dL (ref 0.0–1.2)
TOTAL PROTEIN: 6.7 g/dL (ref 6.0–8.5)

## 2016-12-26 LAB — BASIC METABOLIC PANEL
BUN/Creatinine Ratio: 14 (ref 9–20)
BUN: 14 mg/dL (ref 6–20)
CO2: 24 mmol/L (ref 20–29)
Calcium: 9.3 mg/dL (ref 8.7–10.2)
Chloride: 102 mmol/L (ref 96–106)
Creatinine, Ser: 0.97 mg/dL (ref 0.76–1.27)
GFR calc non Af Amer: 103 mL/min/{1.73_m2} (ref >59)
GFR, EST AFRICAN AMERICAN: 119 mL/min/{1.73_m2} (ref >59)
Glucose: 89 mg/dL (ref 65–99)
Potassium: 4.5 mmol/L (ref 3.5–5.2)
Sodium: 141 mmol/L (ref 134–144)

## 2016-12-26 MED ORDER — ATORVASTATIN CALCIUM 80 MG PO TABS: 80.0000 mg | ORAL_TABLET | Freq: Every day | ORAL | 3 refills | Status: DC

## 2016-12-26 MED ORDER — LISINOPRIL 10 MG PO TABS: 10.0000 mg | ORAL_TABLET | Freq: Every day | ORAL | 3 refills | Status: DC

## 2016-12-26 NOTE — Patient Instructions (Addendum)
Continue your ASA daily  We will add lipitor 80 mg daily for your cholesterol.  Add lisinopril 10 mg daily for blood pressure  We will check blood work today and repeat in 3 months after being on medication  You need to stop smoking completely  We will schedule you for an exercise stress test  Follow up in 3 months.

## 2017-01-02 ENCOUNTER — Telehealth: Payer: Self-pay | Admitting: Cardiology

## 2017-01-02 NOTE — Telephone Encounter (Signed)
Patient calling, states that he would like to know if Dr. SwazilandJordan could write a prescription for a less expensive heartburn medications (like an over the counter medication)?

## 2017-01-03 MED ORDER — PANTOPRAZOLE SODIUM 40 MG PO TBEC: 40.0000 mg | DELAYED_RELEASE_TABLET | Freq: Every day | ORAL | 11 refills | Status: AC

## 2017-01-03 NOTE — Telephone Encounter (Signed)
Returned call to patient spoke to Hartford FinancialHao Meng PA he prescribed protonix 40 mg daily.

## 2017-01-10 ENCOUNTER — Telehealth (HOSPITAL_COMMUNITY): Payer: Self-pay

## 2017-01-10 NOTE — Telephone Encounter (Signed)
Encounter complete. 

## 2017-01-15 ENCOUNTER — Inpatient Hospital Stay (HOSPITAL_COMMUNITY): Admission: RE | Admit: 2017-01-15 | Payer: Medicaid Other | Source: Ambulatory Visit

## 2017-02-05 ENCOUNTER — Telehealth (HOSPITAL_COMMUNITY): Payer: Self-pay

## 2017-02-05 NOTE — Telephone Encounter (Signed)
Encounter complete. 

## 2017-02-06 ENCOUNTER — Telehealth (HOSPITAL_COMMUNITY): Payer: Self-pay

## 2017-02-06 NOTE — Telephone Encounter (Signed)
Encounter complete. 

## 2017-02-06 NOTE — Telephone Encounter (Signed)
Wife did RBV same.

## 2017-02-07 ENCOUNTER — Ambulatory Visit (HOSPITAL_COMMUNITY)
Admission: RE | Admit: 2017-02-07 | Discharge: 2017-02-07 | Disposition: A | Discharge status: Home / Self Care | Payer: Medicaid Other | Source: Ambulatory Visit | Attending: Internal Medicine | Admitting: Internal Medicine

## 2017-02-07 DIAGNOSIS — I10 Essential (primary) hypertension: Secondary | ICD-10-CM

## 2017-02-07 DIAGNOSIS — E782 Mixed hyperlipidemia: Secondary | ICD-10-CM

## 2017-02-07 DIAGNOSIS — I2510 Atherosclerotic heart disease of native coronary artery without angina pectoris: Secondary | ICD-10-CM

## 2017-02-07 LAB — EXERCISE TOLERANCE TEST
CSEPEW: 11.7 METS
CSEPHR: 95 %
Exercise duration (min): 10 min
Exercise duration (sec): 1 s
MPHR: 188 {beats}/min
Peak HR: 179 {beats}/min
RPE: 17
Rest HR: 66 {beats}/min

## 2017-02-08 ENCOUNTER — Telehealth: Payer: Self-pay | Admitting: Cardiology

## 2017-02-08 NOTE — Telephone Encounter (Signed)
Returned call to patient left stress test results on personal voice mail.Advised I will send message to Dr.Jordan for advice if you can hike,run a marathon,work out.

## 2017-02-08 NOTE — Telephone Encounter (Signed)
New message      Returning a call to the nurse to get stress test results.  Pt is at work and cannot talk on the phone.  Ok to leave detail message on vm.  Pt also want to know if he can go hiking, train to run a marathon and "work out".

## 2017-02-08 NOTE — Telephone Encounter (Signed)
Returned call to patient left message on personal voice mail Dr.Jordan's recommendations. 

## 2017-02-08 NOTE — Telephone Encounter (Signed)
Yes he can do all those things- just use common sense. Especially when extreme heat or cold needs to scale back.  Peter SwazilandJordan MD, [facility]

## 2017-02-12 ENCOUNTER — Telehealth: Payer: Self-pay | Admitting: Cardiology

## 2017-02-12 NOTE — Telephone Encounter (Signed)
Pt needs a note for work stating that he can go back to work with no restrictions. Pt wants to know if you can e-mail his note to him please?

## 2017-02-12 NOTE — Telephone Encounter (Signed)
Returned call to patient.Advised Dr.Jordan out of office this week.I will send message to him for advice.He stated he wants letter faxed to him at fax # [phone].

## 2017-02-14 NOTE — Telephone Encounter (Signed)
He may return to work without restrictions.   Darilyn Storbeck SwazilandJordan MD, [facility]

## 2017-02-18 NOTE — Telephone Encounter (Signed)
Returned call to patient.Letter to return to work with no restrictions faxed to # [phone].

## 2017-03-23 NOTE — Progress Notes (Deleted)
Mitchell MolaAlexander L Stokes Date of Birth: [birth year] Medical Record #[id number]  History of Present Illness: Mitchell Stokes is seen for followup CAD. He was last seen in October 2015. He was admitted in June of 2014 with a non-STEMI. Cardiac catheterization demonstrated occlusion of the ostium of the LAD with right-to-left collaterals. Ejection fraction was reduced at 40% by cath. Echocardiogram showed an ejection fraction of 30-35%. He was initially treated medically but because of recurrent chest pain and ST elevation he underwent stenting of the ostium of the LAD with a drug-eluting stent. This was complicated by distal embolization to the third obtuse marginal vessel. PTCA was performed with modest improvement but continued distal occlusion of the OM 3. Subsequent Echo in October 2014 showed LV function had returned to normal. He has a history of HTN, HLD, and polysubstance abuse (Etoh, tobacco, marijuana, narcotics).   On follow up today he states he hasn't followed up because he lost his Medicaid and was unable to afford his visit or medication. Now remarried and has custody of his kids. 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 As part of his custody of his kids he has to stay sober around kids so now only has Etoh once a month. Smokes 5 cigarettes and 2 joints daily.    He has only rare symptoms of chest tightness that are random. Main activity is at work. He does have acid reflux symptoms.   Current Outpatient Prescriptions on File Prior to Visit  Medication Sig Dispense Refill  . albuterol (PROVENTIL HFA;VENTOLIN HFA) 108 (90 BASE) MCG/ACT inhaler Inhale 2 puffs into the lungs every 6 (six) hours as needed for wheezing or shortness of breath. 1 Inhaler 2  . aspirin EC 81 MG EC tablet Take 1 tablet (81 mg total) by mouth daily.    Marland Kitchen. atorvastatin (LIPITOR) 80 MG tablet Take 1 tablet (80 mg total) by mouth daily. 90 tablet 3  . cyanocobalamin 500  MCG tablet Take 500 mcg by mouth daily.    Marland Kitchen. lisinopril (PRINIVIL,ZESTRIL) 10 MG tablet Take 1 tablet (10 mg total) by mouth daily. 90 tablet 3  . nitroGLYCERIN (NITROSTAT) 0.4 MG SL tablet Place 1 tablet (0.4 mg total) under the tongue every 5 (five) minutes as needed for chest pain. 25 tablet 3  . Omega-3 Fatty Acids (FISH OIL) 1000 MG CPDR Take 1,000 mg by mouth 2 (two) times daily. 60 capsule 6  . pantoprazole (PROTONIX) 40 MG tablet Take 1 tablet (40 mg total) by mouth daily. 30 tablet 11   No current facility-administered medications on file prior to visit.     No Known Allergies  Past Medical History:  Diagnosis Date  . Anxiety   . CAD (coronary artery disease)    a. 11/2012 Ant STEMI/Cath/PCI: LM nl, LAD 100p, RI nl, LCX n, RCA nl, EF 40%;  b. After initial trial of Med Rx, pt had further c/p and Ant ST elevation -> Prox LAD stented with 3.0x23 Xience DES, complicated by emoblization down OM3;  c. Recurrent ST elevation and c/p->Cath: patent LAD stent, PTCA of OM3->integrilin x 36 hrs.  Marland Kitchen. GERD (gastroesophageal reflux disease)   . Hypertension   . Ischemic cardiomyopathy    a. 11/2012 Echo: EF 30-35%, mild LVH, mid-distal anteroseptal and apical AK, mildly dil LA.  Marland Kitchen. NSVT (nonsustained ventricular tachycardia) (HCC)    a. 11/2012 Lifevest placed @ d/c.  Marland Kitchen. Polysubstance abuse (HCC)    a. tobacco, marijuana,  etoh, Rx narcotics    Past Surgical History:  Procedure Laterality Date  . LEFT HEART CATHETERIZATION WITH CORONARY ANGIOGRAM N/A 11/10/2012   Procedure: LEFT HEART CATHETERIZATION WITH CORONARY ANGIOGRAM;  Surgeon: Fareed Fung M [place], MD;  Location: [facility] CATH LAB;  Service: Cardiovascular;  Laterality: N/A;  . LEFT HEART CATHETERIZATION WITH CORONARY ANGIOGRAM N/A 11/12/2012   Procedure: LEFT HEART CATHETERIZATION WITH CORONARY ANGIOGRAM;  Surgeon: Lennette Bihari, MD;  Location: [facility] CATH LAB;  Service: Cardiovascular;  Laterality: N/A;  . None    . PERCUTANEOUS CORONARY STENT  INTERVENTION (PCI-S) N/A 11/12/2012   Procedure: PERCUTANEOUS CORONARY STENT INTERVENTION (PCI-S);  Surgeon: Iran Ouch, MD;  Location: [facility] CATH LAB;  Service: Cardiovascular;  Laterality: N/A;    History  Smoking Status  . Current Some Day Smoker  . Packs/day: 0.10  . Years: 5.00  . Types: Cigarettes  Smokeless Tobacco  . Never Used    Comment: One cigarette per day, unless drinking then a pack/day    History  Alcohol Use  . Yes    Comment: drinks "tequila" on the weekends - 1/5 liquor every week (04/01/14)    Family History  Problem Relation Age of Onset  . Hypertension Mother   . Stroke Mother     Review of Systems: As noted in history of present illness.  All other systems were reviewed and are negative.  Physical Exam: There were no vitals taken for this visit. He is an obese, young white male in no acute distress. HEENT: Normal Neck: No JVD or bruits. No adenopathy or thyromegaly. Lungs: scattered diffuse wheezes Cardiovascular: Regular rate and rhythm. Normal S1 and S2. No gallop or murmur. Abdomen: Soft and nontender. Obese.Bowel sounds are positive. Extremities: No cyanosis or edema. Pedal pulses are 2+. Skin: Warm and dry Neuro: Alert and oriented x3. Cranial nerves II through XII are intact.  LABORATORY DATA:  Lab Results  Component Value Date   WBC 6.1 12/26/2016   HGB 15.6 12/26/2016   HCT 44.5 12/26/2016   PLT 243 12/26/2016   GLUCOSE 89 12/26/2016   CHOL 207 (H) 12/26/2016   TRIG 127 12/26/2016   HDL 38 (L) 12/26/2016   LDLDIRECT 103.7 01/14/2013   LDLCALC 144 (H) 12/26/2016   ALT 20 12/26/2016   AST 17 12/26/2016   NA 141 12/26/2016   K 4.5 12/26/2016   CL 102 12/26/2016   CREATININE 0.97 12/26/2016   BUN 14 12/26/2016   CO2 24 12/26/2016   TSH 0.315 (L) 11/09/2012   INR 0.90 11/09/2012   HGBA1C 5.5 11/09/2012    Ecg today: NSR with normal Ecg. I have personally reviewed and interpreted this study.  ETT 02/07/17: Study  Highlights    Blood pressure demonstrated a normal response to exercise.  There was no ST segment deviation noted during stress.  Clinically and electrically negatvie for ischemia  Excellent exercise capacity     Assessment / Plan: 1. Coronary disease status post stenting of the ostium of the LAD in June 2014. Patient is minimally symptomatic. ETT on February 07, 2017 was normal.  2. Ischemic cardiomyopathy. This has resolved  with normalization of his LV function by Echo. 3. Hyperlipidemia. Will resume high-dose statin therapy with lipitor 80 mg daily. Work on dietary modification and weight loss. Will check fasting lab work today and repeat after on medication for 3 months.  4. Hypertension-elevated- will start lisinopril 10 mg daily. 5. History of substance abuse. Continue Etoh abstinence. Recommend complete smoking cessation.  I will follow up in 3 months.

## 2017-03-28 ENCOUNTER — Ambulatory Visit: Payer: Medicaid Other | Admitting: Cardiology

## 2018-02-04 ENCOUNTER — Other Ambulatory Visit: Payer: Self-pay

## 2018-02-04 MED ORDER — LISINOPRIL 10 MG PO TABS: 10.0000 mg | ORAL_TABLET | Freq: Every day | ORAL | 0 refills | Status: DC

## 2018-02-04 MED ORDER — ATORVASTATIN CALCIUM 80 MG PO TABS: 80.0000 mg | ORAL_TABLET | Freq: Every day | ORAL | 0 refills | Status: DC

## 2018-02-05 ENCOUNTER — Other Ambulatory Visit: Payer: Self-pay | Admitting: *Deleted

## 2018-02-05 MED ORDER — ATORVASTATIN CALCIUM 80 MG PO TABS: 80.0000 mg | ORAL_TABLET | Freq: Every day | ORAL | 0 refills | Status: AC

## 2018-02-05 MED ORDER — LISINOPRIL 10 MG PO TABS: 10.0000 mg | ORAL_TABLET | Freq: Every day | ORAL | 0 refills | Status: AC

## 2018-02-05 MED ORDER — ASPIRIN 81 MG PO TBEC: 81.0000 mg | DELAYED_RELEASE_TABLET | Freq: Every day | ORAL | 0 refills | Status: AC

## 2018-04-29 ENCOUNTER — Other Ambulatory Visit: Payer: Self-pay | Admitting: Cardiology
